# Patient Record
Sex: Female | Born: 1955 | Race: White | Hispanic: No | Marital: Married | Smoking: Never smoker
Health system: Southern US, Community
[De-identification: ages and names within clinical notes are randomized; demographics above are authoritative.]

## PROBLEM LIST (undated history)

## (undated) DIAGNOSIS — E119 Type 2 diabetes mellitus without complications: Secondary | ICD-10-CM

## (undated) HISTORY — PX: ABDOMINAL HYSTERECTOMY: SHX81

## (undated) HISTORY — PX: CHOLECYSTECTOMY: SHX55

---

## 1999-10-07 ENCOUNTER — Other Ambulatory Visit: Admission: RE | Admit: 1999-10-07 | Discharge: 1999-10-07 | Payer: Self-pay | Admitting: Internal Medicine

## 2001-04-23 ENCOUNTER — Other Ambulatory Visit: Admission: RE | Admit: 2001-04-23 | Discharge: 2001-04-23 | Payer: Self-pay | Admitting: Internal Medicine

## 2002-06-18 ENCOUNTER — Inpatient Hospital Stay (HOSPITAL_COMMUNITY): Admission: RE | Admit: 2002-06-18 | Discharge: 2002-06-20 | Payer: Self-pay | Admitting: Obstetrics and Gynecology

## 2004-05-10 ENCOUNTER — Ambulatory Visit: Payer: Self-pay | Admitting: Gastroenterology

## 2004-05-19 ENCOUNTER — Ambulatory Visit (HOSPITAL_COMMUNITY): Admission: RE | Admit: 2004-05-19 | Discharge: 2004-05-19 | Payer: Self-pay | Admitting: Gastroenterology

## 2004-06-13 ENCOUNTER — Ambulatory Visit: Payer: Self-pay | Admitting: Internal Medicine

## 2004-06-17 ENCOUNTER — Ambulatory Visit: Payer: Self-pay | Admitting: Family Medicine

## 2004-07-26 ENCOUNTER — Ambulatory Visit: Payer: Self-pay | Admitting: Internal Medicine

## 2004-10-25 ENCOUNTER — Ambulatory Visit: Payer: Self-pay | Admitting: Internal Medicine

## 2004-12-29 ENCOUNTER — Ambulatory Visit: Payer: Self-pay | Admitting: Internal Medicine

## 2005-01-30 ENCOUNTER — Ambulatory Visit: Payer: Self-pay | Admitting: Internal Medicine

## 2005-02-03 ENCOUNTER — Ambulatory Visit: Payer: Self-pay | Admitting: Internal Medicine

## 2005-02-27 ENCOUNTER — Ambulatory Visit: Payer: Self-pay | Admitting: Internal Medicine

## 2005-03-07 ENCOUNTER — Encounter: Admission: RE | Admit: 2005-03-07 | Discharge: 2005-06-05 | Payer: Self-pay | Admitting: Internal Medicine

## 2005-04-14 ENCOUNTER — Ambulatory Visit: Payer: Self-pay | Admitting: Internal Medicine

## 2005-10-10 ENCOUNTER — Ambulatory Visit: Payer: Self-pay | Admitting: Internal Medicine

## 2019-11-14 ENCOUNTER — Emergency Department (HOSPITAL_COMMUNITY): Payer: HRSA Program

## 2019-11-14 ENCOUNTER — Encounter (HOSPITAL_COMMUNITY): Payer: Self-pay | Admitting: *Deleted

## 2019-11-14 ENCOUNTER — Other Ambulatory Visit: Payer: Self-pay

## 2019-11-14 ENCOUNTER — Inpatient Hospital Stay (HOSPITAL_COMMUNITY)
Admission: EM | Admit: 2019-11-14 | Discharge: 2019-12-02 | DRG: 177 | Disposition: E | Payer: HRSA Program | Attending: Internal Medicine | Admitting: Internal Medicine

## 2019-11-14 DIAGNOSIS — E1169 Type 2 diabetes mellitus with other specified complication: Secondary | ICD-10-CM

## 2019-11-14 DIAGNOSIS — J1282 Pneumonia due to coronavirus disease 2019: Secondary | ICD-10-CM

## 2019-11-14 DIAGNOSIS — I82492 Acute embolism and thrombosis of other specified deep vein of left lower extremity: Secondary | ICD-10-CM | POA: Diagnosis not present

## 2019-11-14 DIAGNOSIS — J9601 Acute respiratory failure with hypoxia: Secondary | ICD-10-CM | POA: Diagnosis not present

## 2019-11-14 DIAGNOSIS — Z6831 Body mass index (BMI) 31.0-31.9, adult: Secondary | ICD-10-CM | POA: Diagnosis not present

## 2019-11-14 DIAGNOSIS — U071 COVID-19: Principal | ICD-10-CM | POA: Diagnosis present

## 2019-11-14 DIAGNOSIS — Z90710 Acquired absence of both cervix and uterus: Secondary | ICD-10-CM

## 2019-11-14 DIAGNOSIS — R630 Anorexia: Secondary | ICD-10-CM | POA: Diagnosis present

## 2019-11-14 DIAGNOSIS — Z7984 Long term (current) use of oral hypoglycemic drugs: Secondary | ICD-10-CM

## 2019-11-14 DIAGNOSIS — F329 Major depressive disorder, single episode, unspecified: Secondary | ICD-10-CM | POA: Diagnosis present

## 2019-11-14 DIAGNOSIS — E1165 Type 2 diabetes mellitus with hyperglycemia: Secondary | ICD-10-CM | POA: Diagnosis not present

## 2019-11-14 DIAGNOSIS — Z515 Encounter for palliative care: Secondary | ICD-10-CM | POA: Diagnosis not present

## 2019-11-14 DIAGNOSIS — R0602 Shortness of breath: Secondary | ICD-10-CM | POA: Diagnosis not present

## 2019-11-14 DIAGNOSIS — E876 Hypokalemia: Secondary | ICD-10-CM | POA: Diagnosis not present

## 2019-11-14 DIAGNOSIS — J80 Acute respiratory distress syndrome: Secondary | ICD-10-CM | POA: Diagnosis present

## 2019-11-14 DIAGNOSIS — I82462 Acute embolism and thrombosis of left calf muscular vein: Secondary | ICD-10-CM | POA: Diagnosis not present

## 2019-11-14 DIAGNOSIS — Z66 Do not resuscitate: Secondary | ICD-10-CM | POA: Diagnosis not present

## 2019-11-14 DIAGNOSIS — R0902 Hypoxemia: Secondary | ICD-10-CM

## 2019-11-14 DIAGNOSIS — E669 Obesity, unspecified: Secondary | ICD-10-CM | POA: Diagnosis present

## 2019-11-14 DIAGNOSIS — J810 Acute pulmonary edema: Secondary | ICD-10-CM | POA: Diagnosis present

## 2019-11-14 DIAGNOSIS — T380X5A Adverse effect of glucocorticoids and synthetic analogues, initial encounter: Secondary | ICD-10-CM | POA: Diagnosis not present

## 2019-11-14 DIAGNOSIS — I824Y2 Acute embolism and thrombosis of unspecified deep veins of left proximal lower extremity: Secondary | ICD-10-CM | POA: Diagnosis not present

## 2019-11-14 DIAGNOSIS — I82409 Acute embolism and thrombosis of unspecified deep veins of unspecified lower extremity: Secondary | ICD-10-CM

## 2019-11-14 DIAGNOSIS — E66812 Obesity, class 2: Secondary | ICD-10-CM

## 2019-11-14 DIAGNOSIS — R0600 Dyspnea, unspecified: Secondary | ICD-10-CM

## 2019-11-14 DIAGNOSIS — R7989 Other specified abnormal findings of blood chemistry: Secondary | ICD-10-CM | POA: Diagnosis not present

## 2019-11-14 HISTORY — DX: Type 2 diabetes mellitus without complications: E11.9

## 2019-11-14 LAB — CBG MONITORING, ED: Glucose-Capillary: 364 mg/dL — ABNORMAL HIGH (ref 70–99)

## 2019-11-14 LAB — C-REACTIVE PROTEIN
CRP: 16.4 mg/dL — ABNORMAL HIGH (ref <1.0)
CRP: 16.2 mg/dL — ABNORMAL HIGH (ref <1.0)

## 2019-11-14 LAB — CBC WITH DIFFERENTIAL/PLATELET
Abs Immature Granulocytes: 0.19 10*3/uL — ABNORMAL HIGH (ref 0.00–0.07)
Basophils Absolute: 0.1 10*3/uL (ref 0.0–0.1)
Basophils Relative: 0 %
Eosinophils Absolute: 0 10*3/uL (ref 0.0–0.5)
Eosinophils Relative: 0 %
HCT: 40.6 % (ref 36.0–46.0)
Hemoglobin: 14.5 g/dL (ref 12.0–15.0)
Immature Granulocytes: 2 %
Lymphocytes Relative: 11 %
Lymphs Abs: 1.3 10*3/uL (ref 0.7–4.0)
MCH: 30.5 pg (ref 26.0–34.0)
MCHC: 35.7 g/dL (ref 30.0–36.0)
MCV: 85.5 fL (ref 80.0–100.0)
Monocytes Absolute: 0.8 10*3/uL (ref 0.1–1.0)
Monocytes Relative: 6 %
Neutro Abs: 10.2 10*3/uL — ABNORMAL HIGH (ref 1.7–7.7)
Neutrophils Relative %: 81 %
Platelets: 203 10*3/uL (ref 150–400)
RBC: 4.75 MIL/uL (ref 3.87–5.11)
RDW: 13.7 % (ref 11.5–15.5)
WBC: 12.5 10*3/uL — ABNORMAL HIGH (ref 4.0–10.5)
nRBC: 0 % (ref 0.0–0.2)

## 2019-11-14 LAB — FERRITIN: Ferritin: 530 ng/mL — ABNORMAL HIGH (ref 11–307)

## 2019-11-14 LAB — TROPONIN I (HIGH SENSITIVITY)
Troponin I (High Sensitivity): 75 ng/L — ABNORMAL HIGH (ref <18)
Troponin I (High Sensitivity): 86 ng/L — ABNORMAL HIGH (ref <18)

## 2019-11-14 LAB — COMPREHENSIVE METABOLIC PANEL
ALT: 28 U/L (ref 0–44)
AST: 46 U/L — ABNORMAL HIGH (ref 15–41)
Albumin: 3.1 g/dL — ABNORMAL LOW (ref 3.5–5.0)
Alkaline Phosphatase: 67 U/L (ref 38–126)
Anion gap: 15 (ref 5–15)
BUN: 16 mg/dL (ref 8–23)
CO2: 22 mmol/L (ref 22–32)
Calcium: 8 mg/dL — ABNORMAL LOW (ref 8.9–10.3)
Chloride: 96 mmol/L — ABNORMAL LOW (ref 98–111)
Creatinine, Ser: 0.77 mg/dL (ref 0.44–1.00)
GFR calc Af Amer: >60 mL/min (ref >60)
GFR calc non Af Amer: >60 mL/min (ref >60)
Glucose, Bld: 392 mg/dL — ABNORMAL HIGH (ref 70–99)
Potassium: 3.7 mmol/L (ref 3.5–5.1)
Sodium: 133 mmol/L — ABNORMAL LOW (ref 135–145)
Total Bilirubin: 1 mg/dL (ref 0.3–1.2)
Total Protein: 7.5 g/dL (ref 6.5–8.1)

## 2019-11-14 LAB — BRAIN NATRIURETIC PEPTIDE: B Natriuretic Peptide: 200.6 pg/mL — ABNORMAL HIGH (ref 0.0–100.0)

## 2019-11-14 LAB — SARS CORONAVIRUS 2 (TAT 6-24 HRS): SARS Coronavirus 2: POSITIVE — AB

## 2019-11-14 LAB — BLOOD GAS, VENOUS
Acid-base deficit: 1.8 mmol/L (ref 0.0–2.0)
Bicarbonate: 23.2 mmol/L (ref 20.0–28.0)
FIO2: 21
O2 Saturation: 54.2 %
Patient temperature: 98.6
pCO2, Ven: 42.6 mmHg — ABNORMAL LOW (ref 44.0–60.0)
pH, Ven: 7.356 (ref 7.250–7.430)
pO2, Ven: 31.9 mmHg — CL (ref 32.0–45.0)

## 2019-11-14 LAB — D-DIMER, QUANTITATIVE: D-Dimer, Quant: 3.5 ug/mL-FEU — ABNORMAL HIGH (ref 0.00–0.50)

## 2019-11-14 LAB — GLUCOSE, CAPILLARY: Glucose-Capillary: 434 mg/dL — ABNORMAL HIGH (ref 70–99)

## 2019-11-14 LAB — HEMOGLOBIN A1C
Hgb A1c MFr Bld: 7.9 % — ABNORMAL HIGH (ref 4.8–5.6)
Mean Plasma Glucose: 180.03 mg/dL

## 2019-11-14 LAB — LACTIC ACID, PLASMA
Lactic Acid, Venous: 2.4 mmol/L (ref 0.5–1.9)
Lactic Acid, Venous: 2 mmol/L (ref 0.5–1.9)

## 2019-11-14 LAB — ABO/RH: ABO/RH(D): A POS

## 2019-11-14 LAB — LACTATE DEHYDROGENASE: LDH: 541 U/L — ABNORMAL HIGH (ref 98–192)

## 2019-11-14 LAB — TRIGLYCERIDES: Triglycerides: 97 mg/dL (ref <150)

## 2019-11-14 LAB — FIBRINOGEN: Fibrinogen: >800 mg/dL — ABNORMAL HIGH (ref 210–475)

## 2019-11-14 LAB — PROCALCITONIN: Procalcitonin: 0.24 ng/mL

## 2019-11-14 MED ORDER — ACETAMINOPHEN 325 MG PO TABS
650.0000 mg | ORAL_TABLET | Freq: Four times a day (QID) | ORAL | Status: DC | PRN
  Administered 2019-11-22: 650 mg via ORAL
  Filled 2019-11-14: qty 2

## 2019-11-14 MED ORDER — ENOXAPARIN SODIUM 40 MG/0.4ML ~~LOC~~ SOLN
40.0000 mg | SUBCUTANEOUS | Status: DC
  Administered 2019-11-14 – 2019-11-15 (×2): 40 mg via SUBCUTANEOUS
  Filled 2019-11-14 (×2): qty 0.4

## 2019-11-14 MED ORDER — SODIUM CHLORIDE 0.9 % IV SOLN: 100.0000 mg | Freq: Every day | INTRAVENOUS | Status: DC

## 2019-11-14 MED ORDER — ZINC SULFATE 220 (50 ZN) MG PO CAPS
220.0000 mg | ORAL_CAPSULE | Freq: Every day | ORAL | Status: DC
  Administered 2019-11-14 – 2019-11-22 (×9): 220 mg via ORAL
  Filled 2019-11-14 (×9): qty 1

## 2019-11-14 MED ORDER — METHYLPREDNISOLONE SODIUM SUCC 40 MG IJ SOLR
0.5000 mg/kg | Freq: Two times a day (BID) | INTRAMUSCULAR | Status: DC
  Administered 2019-11-14 – 2019-11-20 (×12): 36.4 mg via INTRAVENOUS
  Filled 2019-11-14 (×12): qty 1

## 2019-11-14 MED ORDER — SODIUM CHLORIDE 0.9 % IV SOLN
100.0000 mg | INTRAVENOUS | Status: AC
  Administered 2019-11-14 (×2): 100 mg via INTRAVENOUS
  Filled 2019-11-14: qty 20

## 2019-11-14 MED ORDER — LINAGLIPTIN 5 MG PO TABS
5.0000 mg | ORAL_TABLET | Freq: Every day | ORAL | Status: DC
  Administered 2019-11-14 – 2019-11-22 (×9): 5 mg via ORAL
  Filled 2019-11-14 (×10): qty 1

## 2019-11-14 MED ORDER — DEXAMETHASONE SODIUM PHOSPHATE 10 MG/ML IJ SOLN
10.0000 mg | Freq: Once | INTRAMUSCULAR | Status: AC
  Administered 2019-11-14: 10 mg via INTRAVENOUS
  Filled 2019-11-14: qty 1

## 2019-11-14 MED ORDER — HYDROCOD POLST-CPM POLST ER 10-8 MG/5ML PO SUER
5.0000 mL | Freq: Two times a day (BID) | ORAL | Status: DC | PRN
  Administered 2019-11-15 – 2019-11-16 (×2): 5 mL via ORAL
  Filled 2019-11-14 (×3): qty 5

## 2019-11-14 MED ORDER — INSULIN ASPART 100 UNIT/ML ~~LOC~~ SOLN
0.0000 [IU] | Freq: Three times a day (TID) | SUBCUTANEOUS | Status: DC
  Administered 2019-11-14: 15 [IU] via SUBCUTANEOUS
  Administered 2019-11-15: 11 [IU] via SUBCUTANEOUS
  Administered 2019-11-15: 5 [IU] via SUBCUTANEOUS
  Administered 2019-11-15: 11 [IU] via SUBCUTANEOUS
  Administered 2019-11-16: 8 [IU] via SUBCUTANEOUS
  Administered 2019-11-16 (×2): 15 [IU] via SUBCUTANEOUS
  Administered 2019-11-17: 3 [IU] via SUBCUTANEOUS
  Administered 2019-11-17: 15 [IU] via SUBCUTANEOUS
  Administered 2019-11-17: 5 [IU] via SUBCUTANEOUS
  Administered 2019-11-18: 3 [IU] via SUBCUTANEOUS
  Administered 2019-11-18: 8 [IU] via SUBCUTANEOUS
  Administered 2019-11-18: 5 [IU] via SUBCUTANEOUS
  Administered 2019-11-19: 8 [IU] via SUBCUTANEOUS
  Administered 2019-11-19: 3 [IU] via SUBCUTANEOUS
  Administered 2019-11-19: 2 [IU] via SUBCUTANEOUS
  Administered 2019-11-20 (×2): 3 [IU] via SUBCUTANEOUS
  Administered 2019-11-20: 11 [IU] via SUBCUTANEOUS
  Administered 2019-11-21: 8 [IU] via SUBCUTANEOUS
  Administered 2019-11-21 (×2): 5 [IU] via SUBCUTANEOUS
  Administered 2019-11-22: 8 [IU] via SUBCUTANEOUS
  Administered 2019-11-22: 11 [IU] via SUBCUTANEOUS
  Administered 2019-11-22: 8 [IU] via SUBCUTANEOUS
  Administered 2019-11-23: 3 [IU] via SUBCUTANEOUS
  Filled 2019-11-14: qty 0.15

## 2019-11-14 MED ORDER — ALBUTEROL SULFATE HFA 108 (90 BASE) MCG/ACT IN AERS
2.0000 | INHALATION_SPRAY | Freq: Four times a day (QID) | RESPIRATORY_TRACT | Status: DC
  Administered 2019-11-14 – 2019-11-19 (×20): 2 via RESPIRATORY_TRACT
  Filled 2019-11-14: qty 6.7

## 2019-11-14 MED ORDER — ORAL CARE MOUTH RINSE
15.0000 mL | Freq: Two times a day (BID) | OROMUCOSAL | Status: DC
  Administered 2019-11-14 – 2019-11-22 (×14): 15 mL via OROMUCOSAL

## 2019-11-14 MED ORDER — TOCILIZUMAB 400 MG/20ML IV SOLN
8.0000 mg/kg | Freq: Once | INTRAVENOUS | Status: AC
  Administered 2019-11-14: 580 mg via INTRAVENOUS
  Filled 2019-11-14: qty 29

## 2019-11-14 MED ORDER — GUAIFENESIN-DM 100-10 MG/5ML PO SYRP
10.0000 mL | ORAL_SOLUTION | ORAL | Status: DC | PRN
  Administered 2019-11-15 – 2019-11-22 (×6): 10 mL via ORAL
  Filled 2019-11-14 (×9): qty 10

## 2019-11-14 MED ORDER — ASCORBIC ACID 500 MG PO TABS
500.0000 mg | ORAL_TABLET | Freq: Every day | ORAL | Status: DC
  Administered 2019-11-14 – 2019-11-22 (×9): 500 mg via ORAL
  Filled 2019-11-14 (×9): qty 1

## 2019-11-14 MED ORDER — ONDANSETRON HCL 4 MG/2ML IJ SOLN
4.0000 mg | Freq: Four times a day (QID) | INTRAMUSCULAR | Status: DC | PRN
  Administered 2019-11-20: 4 mg via INTRAVENOUS
  Filled 2019-11-14: qty 2

## 2019-11-14 MED ORDER — INSULIN ASPART 100 UNIT/ML ~~LOC~~ SOLN
0.0000 [IU] | Freq: Every day | SUBCUTANEOUS | Status: DC
  Administered 2019-11-15: 3 [IU] via SUBCUTANEOUS
  Administered 2019-11-17: 2 [IU] via SUBCUTANEOUS
  Administered 2019-11-19: 3 [IU] via SUBCUTANEOUS
  Administered 2019-11-21 – 2019-11-22 (×2): 2 [IU] via SUBCUTANEOUS
  Filled 2019-11-14: qty 0.05

## 2019-11-14 MED ORDER — ONDANSETRON HCL 4 MG PO TABS: 4.0000 mg | ORAL_TABLET | Freq: Four times a day (QID) | ORAL | Status: DC | PRN

## 2019-11-14 MED ORDER — INSULIN ASPART 100 UNIT/ML ~~LOC~~ SOLN
20.0000 [IU] | Freq: Once | SUBCUTANEOUS | Status: AC
  Administered 2019-11-14: 20 [IU] via SUBCUTANEOUS

## 2019-11-14 MED ORDER — SODIUM CHLORIDE 0.9 % IV SOLN: 200.0000 mg | Freq: Once | INTRAVENOUS | Status: DC

## 2019-11-14 MED ORDER — SODIUM CHLORIDE 0.9 % IV SOLN
100.0000 mg | Freq: Every day | INTRAVENOUS | Status: AC
  Administered 2019-11-15 – 2019-11-18 (×4): 100 mg via INTRAVENOUS
  Filled 2019-11-14 (×4): qty 20

## 2019-11-14 NOTE — Progress Notes (Signed)
Came to ED to assess pt. Pt is on NRB O2 saturations 90-92% respiratory rate 32. Recommending pt trying to prone if possible.

## 2019-11-14 NOTE — ED Notes (Signed)
Pt wants to call son and update him

## 2019-11-14 NOTE — ED Notes (Signed)
Per patient, please update Silverio Lay (friend) at [phone] with any information.

## 2019-11-14 NOTE — ED Notes (Signed)
Pt sitting up in bed awake and talking. Pt gives permission to update son Barbara Cower. Denies any needs. Will continue to monitor.

## 2019-11-14 NOTE — H&P (Addendum)
History and Physical    Katrina Willis:[id number] DOB: February 24, 1956 DOA: 12/06/2019  I have briefly reviewed the Katrina Willis's prior medical records in [place]  PCP: Katrina Willis, No Pcp Per  Katrina Willis coming from: home  Chief Complaint: Shortness of breath  HPI: Katrina Willis is a 64 y.o. female without any significant medical problems but remote history of diabetes, comes to the hospital with complaints of shortness of breath.  Katrina Willis normally resides in [place] and recently traveled to the [place] on 5/4 to visit her son in [place] and now here in town for some friends.  She tells me that for the past 10 days since arrival to the [place] she has been experiencing flulike symptoms.  She tells me her son is also sick.  She was having a cough which actually appreciates that he has improved in the last couple of days.  Is also been complaining of fatigue, weakness, poor p.o. intake.  No nausea, no vomiting, no diarrhea.  No loss of taste or smell.  No chest pain.  Tells me that her shortness of breath has gotten significantly worse in the last 24 hours and she is barely able to get out of bed and take a few steps.  She [facility] and is returned positive  ED Course: In the emergency room she is afebrile to 97.8, however she is tachypneic with respiratory rate into the 30s, normotensive, and she was satting in the 60s on room air requiring 15 L nonrebreather to maintain sats in the low 90s.  Chest x-ray showed multifocal pneumonia.  Blood work reveals an elevated glucose to 392, CRP is fairly elevated to 16.4.  Lactic acid 2.4.  High-sensitivity troponin 75.  BNP 200.  D-dimer 3.5.  SARS-CoV-2 here pending  Review of Systems: All systems reviewed, and apart from HPI, all negative  Past Medical History:  Diagnosis Date  . Diabetes mellitus without complication [facility])     Past Surgical History:  Procedure Laterality Date  . ABDOMINAL HYSTERECTOMY    .  CHOLECYSTECTOMY       reports that she has never smoked. She has never used smokeless tobacco. She reports previous alcohol use. She reports previous drug use.  Not on File  Family history reviewed and noncontributory  Prior to Admission medications   Not on File    Physical Exam: Vitals:   12-06-2019 1445 12/06/2019 1500 2019/12/06 1515 12-06-2019 1530  BP: 123/70 (!) 137/108 105/86 120/66  Pulse: 77 77 73 73  Resp: (!) 33 (!) 28 (!) 32 (!) 32  Temp:      TempSrc:      SpO2: 90% 92% 92% 91%  Weight:      Height:        Constitutional: Tachypneic, appears a bit uncomfortable Eyes: No scleral icterus ENMT: Mucous membranes are moist.  Neck: normal, supple Respiratory: Bibasilar rhonchi, no wheezing heard.  Increased respiratory effort, tachypneic Cardiovascular: Regular rate and rhythm, no murmurs / rubs / gallops. No extremity edema.   Abdomen: no tenderness, no masses palpated. Bowel sounds positive.  Musculoskeletal: no clubbing / cyanosis. Skin: No rashes seen Neurologic: No focal deficits, equal strength Psychiatric: Normal judgment and insight. Alert and oriented x 3.  Labs on Admission: I have personally reviewed following labs and imaging studies  CBC: Recent Labs  Lab 12-06-2019 1407  WBC 12.5*  NEUTROABS 10.2*  HGB 14.5  HCT 40.6  MCV 85.5  PLT 203  Basic Metabolic Panel: Recent Labs  Lab 11/30/2019 1407  NA 133*  K 3.7  CL 96*  CO2 22  GLUCOSE 392*  BUN 16  CREATININE 0.77  CALCIUM 8.0*   Liver Function Tests: Recent Labs  Lab 11/24/2019 1407  AST 46*  ALT 28  ALKPHOS 67  BILITOT 1.0  PROT 7.5  ALBUMIN 3.1*   Coagulation Profile: No results for input(s): INR, PROTIME in the last 168 hours. BNP (last 3 results) No results for input(s): PROBNP in the last 8760 hours. CBG: No results for input(s): GLUCAP in the last 168 hours. Thyroid Function Tests: No results for input(s): TSH, T4TOTAL, FREET4, T3FREE, THYROIDAB in the last 72  hours. Urine analysis: No results found for: COLORURINE, APPEARANCEUR, LABSPEC, PHURINE, GLUCOSEU, HGBUR, BILIRUBINUR, KETONESUR, PROTEINUR, UROBILINOGEN, NITRITE, LEUKOCYTESUR   Radiological Exams on Admission: DG Chest Port 1 View  Result Date: 11/10/2019 CLINICAL DATA:  Cough, shortness of breath, and headache. COVID positive. EXAM: PORTABLE CHEST 1 VIEW COMPARISON:  None. FINDINGS: The heart size and mediastinal contours are within normal limits. Normal pulmonary vascularity. Bilateral mid and lower lung interstitial and hazy airspace opacities. No pleural effusion or pneumothorax. No acute osseous abnormality. IMPRESSION: 1. Multifocal pneumonia consistent with history of COVID-19 infection. Electronically Signed   By: Obie Dredge M.D.   On: 11/22/2019 13:01    EKG: Independently reviewed.  Sinus rhythm  Assessment/Plan  Principal Problem Acute hypoxic respiratory failure due to COVID-19 viral pneumonia-Katrina Willis with profound hypoxia requiring nonrebreather in the ED.  Will admit to stepdown, she is high risk of decompensation.  Her CRP significantly elevated, I have discussed risks/benefits regarding Actemra and she agrees to its use. The Katrina Willis has hypoxia and is high-risk for intubation (due to age, CRP), but expected to survive >48 hours and has good baseline functional status.  She is not known to be on immunomodulators, anti-rejection medications, or cancer chemotherapy, has no history of TB or latent TB, and no history of diverticulitis or intestinal perforation.  Platelets are >50K, ANC is >500, and ALT/AST are below 5x ULN with no known hepatitis B infection. The investigational nature of this medication was discussed with the Katrina Willis/HCPOA and she agrees the use of this medication.  Will administer as soon as her Covid test confirms positivity here -Start remdesivir, steroids  COVID-19 Labs  Recent Labs    11/02/2019 1240 11/12/2019 1407  DDIMER  --  3.50*  FERRITIN 530*  --    LDH  --  541*  CRP 16.4*  --     No results found for: SARSCOV2NAA    DM2 -Katrina Willis does not seem to be medications for this however her CBG on admission is elevated at 392.  Placed on sliding scale, will likely need long-acting insulin while here  DVT prophylaxis: Lovenox  Code Status: Full code  Family Communication: d/w Katrina Willis  Disposition Plan: home when ready  Bed Type: stepdown Consults called: none  Obs/Inp: inpatient   At the time of admission, it appears that the appropriate admission status for this Katrina Willis is INPATIENT as it is expected that Katrina Willis will require hospital care > 2 midnights. This is judged to be reasonable and necessary in order to provide the required intensity of service to ensure the Katrina Willis's safety given: presenting symptoms, initial radiographic and laboratory data and verity of hypoxia. Together, these circumstances are felt to place Katrina Willis at high at high risk for further clinical deterioration threatening life, limb, or organ.  Addendum: Our SARS-CoV-2 test has  not resulted yet, [facility] [address] and [facility] were able to confirm [facility], faxed [place] positive antigen test done yesterday 4/13.  Administer Actemra      Pamella Pert, MD, PhD [facility]  Contact via [email or web address]  11/05/2019, 3:49 PM

## 2019-11-14 NOTE — ED Triage Notes (Signed)
Pt reports feeling short of breath, cough, weakness, headaches 10 days ago after leaving [place]. [facility] No fevers.

## 2019-11-14 NOTE — ED Provider Notes (Addendum)
[facility]-EMERGENCY DEPT Provider Note   CSN: [id number] Arrival date & time: 12-10-19  1112     History Chief Complaint  Patient presents with  . Weakness    COVID positive    Katrina Willis is a 64 y.o. female history of diabetes otherwise healthy.  Patient presents today for shortness of breath, cough, generalized weakness.  She reports she recently returned to [place] from [place], she was visiting her son in [place] when she became ill.  She reports that on May 4 that she developed body aches fatigue nonproductive cough.  Symptoms have gradually worsened over the last 10 days, her primary complaint today is shortness of breath worsened with ambulation feeling of inability to catch her breath minimally improved with rest.  Associated with nonproductive cough, burning chest pain only with cough and generalized weakness.  She reports that [facility] and was positive.  She has not had her Covid vaccine.  Patient reports feeling warm at home but denies fever.  Denies vision change, sore throat, neck stiffness, hemoptysis, abdominal pain, nausea/vomiting, diarrhea, dysuria, extremity swelling/color change or any additional concerns.  HPI     Past Medical History:  Diagnosis Date  . Diabetes mellitus without complication (HCC)     There are no problems to display for this patient.   Past Surgical History:  Procedure Laterality Date  . ABDOMINAL HYSTERECTOMY    . CHOLECYSTECTOMY       OB History   No obstetric history on file.     No family history on file.  Social History   Tobacco Use  . Smoking status: Never Smoker  . Smokeless tobacco: Never Used  Substance Use Topics  . Alcohol use: Not Currently  . Drug use: Not Currently    Home Medications Prior to Admission medications   Not on File    Allergies    Patient has no allergy information on record.  Review of Systems   Review of Systems Ten  systems are reviewed and are negative for acute change except as noted in the HPI  Physical Exam Updated Vital Signs BP 136/61 (BP Location: Left Arm)   Pulse 84   Temp 97.8 F (36.6 C) (Oral)   Resp (!) 22   SpO2 99%   Physical Exam Constitutional:      General: She is not in acute distress.    Appearance: Normal appearance. She is well-developed. She is not ill-appearing or diaphoretic.  HENT:     Head: Normocephalic and atraumatic.     Right Ear: External ear normal.     Left Ear: External ear normal.     Nose: Nose normal.  Eyes:     General: Vision grossly intact. Gaze aligned appropriately.     Pupils: Pupils are equal, round, and reactive to light.  Neck:     Trachea: Trachea and phonation normal. No tracheal deviation.  Cardiovascular:     Rate and Rhythm: Normal rate and regular rhythm.     Pulses: Normal pulses.     Heart sounds: Normal heart sounds.  Pulmonary:     Effort: Pulmonary effort is normal. No respiratory distress.     Breath sounds: Normal breath sounds.  Abdominal:     General: There is no distension.     Palpations: Abdomen is soft.     Tenderness: There is no abdominal tenderness. There is no guarding or rebound.  Musculoskeletal:  General: Normal range of motion.     Cervical back: Normal range of motion.     Right lower leg: No edema.     Left lower leg: No edema.  Skin:    General: Skin is warm and dry.  Neurological:     Mental Status: She is alert.     GCS: GCS eye subscore is 4. GCS verbal subscore is 5. GCS motor subscore is 6.     Comments: Speech is clear and goal oriented, follows commands Major Cranial nerves without deficit, no facial droop Moves extremities without ataxia, coordination intact  Psychiatric:        Behavior: Behavior normal.     ED Results / Procedures / Treatments   Labs (all labs ordered are listed, but only abnormal results are displayed) Labs Reviewed - No data to  display  EKG None  Radiology No results found.  Procedures .Critical Care Performed by: Bill Salinas, PA-C Authorized by: Bill Salinas, PA-C   Critical care provider statement:    Critical care time (minutes):  50   Critical care was necessary to treat or prevent imminent or life-threatening deterioration of the following conditions:  Respiratory failure   Critical care was time spent personally by me on the following activities:  Discussions with consultants, evaluation of patient's response to treatment, examination of patient, ordering and performing treatments and interventions, ordering and review of laboratory studies, ordering and review of radiographic studies, pulse oximetry, re-evaluation of patient's condition, obtaining history from patient or surrogate, review of old charts and development of treatment plan with patient or surrogate   (including critical care time)  Medications Ordered in ED Medications - No data to display  ED Course  I have reviewed the triage vital signs and the nursing notes.  Pertinent labs & imaging results that were available during my care of the patient were reviewed by me and considered in my medical decision making (see chart for details).     Katrina Willis was evaluated in Emergency Department on 11-21-2019 for the symptoms described in the history of present illness. She was evaluated in the context of the global COVID-19 pandemic, which necessitated consideration that the patient might be at risk for infection with the SARS-CoV-2 virus that causes COVID-19. Institutional protocols and algorithms that pertain to the evaluation of patients at risk for COVID-19 are in a state of rapid change based on information released by regulatory bodies including the CDC and federal and state organizations. These policies and algorithms were followed during the patient's care in the ED. MDM Rules/Calculators/A&P                       64 year old female with history of diabetes presents today on her [facility] day [facility]  Cranial nerves intact, no meningeal signs, airway patent, heart regular rate and rhythm, lungs clear, abdomen soft nontender, neurovascular tact to all 4 extremities.  No evidence of DVT.  She mainly is presenting for shortness of breath.  SPO2 60% on room air on arrival.  Improved with nonrebreather.  She denies any history of blood clots extremity swelling or additional concerns today.  She denies chest pain as long as she is not coughing.  Patient will need admission, Covid order set utilized, I have added a troponin and BNP as well.  10 mg Decadron ordered empirically.  Will await hospitalist recommendations for remdesivir.  Patient stable on nonrebreather.  Patient  seen and evaluated by Dr. Zenia Resides agrees with care plan. --------------------------------- Blood gas is significant for PO2 of 31.9.  All remaining blood work is pending.  Chest x-ray:  IMPRESSION:  1. Multifocal pneumonia consistent with history of COVID-19  infection.  I personally reviewed patient's chest x-ray and agree with radiologist interpretation. - Patient reassessed sats remain upper 90s on nonrebreather.  No acute distress.  Patient seen and evaluated by respiratory therapy who recommended proning which will be facilitated by RN.  Consult placed to hospitalist for admission awaiting callback.  Suspect symptoms today secondary to COVID-19 viral infection and low suspicion for ACS, PE, dissection or other pathologies at this time. - 3:15 PM: Discussed case with Dr. Cruzita Lederer, patient has been accepted to hospitalist service.  CBC with leukocytosis of 12.5, no evidence of anemia, white counts are pending.  Lactic 2.4.  Remainder of labs are pending at this time.  Note: Portions of this report may have been transcribed using voice recognition software. Every effort was made to ensure accuracy; however, inadvertent  computerized transcription errors may still be present. Final Clinical Impression(s) / ED Diagnoses Final diagnoses:  SOB (shortness of breath)    Rx / DC Orders ED Discharge Orders    None        Gari Crown 11/13/2019 1518    Lacretia Leigh, MD 11/15/19 1558

## 2019-11-15 ENCOUNTER — Inpatient Hospital Stay (HOSPITAL_COMMUNITY): Payer: HRSA Program

## 2019-11-15 DIAGNOSIS — E1165 Type 2 diabetes mellitus with hyperglycemia: Secondary | ICD-10-CM

## 2019-11-15 LAB — COMPREHENSIVE METABOLIC PANEL
ALT: 29 U/L (ref 0–44)
AST: 43 U/L — ABNORMAL HIGH (ref 15–41)
Albumin: 2.9 g/dL — ABNORMAL LOW (ref 3.5–5.0)
Alkaline Phosphatase: 65 U/L (ref 38–126)
Anion gap: 9 (ref 5–15)
BUN: 24 mg/dL — ABNORMAL HIGH (ref 8–23)
CO2: 24 mmol/L (ref 22–32)
Calcium: 8.4 mg/dL — ABNORMAL LOW (ref 8.9–10.3)
Chloride: 103 mmol/L (ref 98–111)
Creatinine, Ser: 0.76 mg/dL (ref 0.44–1.00)
GFR calc Af Amer: >60 mL/min (ref >60)
GFR calc non Af Amer: >60 mL/min (ref >60)
Glucose, Bld: 356 mg/dL — ABNORMAL HIGH (ref 70–99)
Potassium: 3.5 mmol/L (ref 3.5–5.1)
Sodium: 136 mmol/L (ref 135–145)
Total Bilirubin: 0.9 mg/dL (ref 0.3–1.2)
Total Protein: 7.1 g/dL (ref 6.5–8.1)

## 2019-11-15 LAB — CBC WITH DIFFERENTIAL/PLATELET
Abs Immature Granulocytes: 0.18 10*3/uL — ABNORMAL HIGH (ref 0.00–0.07)
Basophils Absolute: 0 10*3/uL (ref 0.0–0.1)
Basophils Relative: 0 %
Eosinophils Absolute: 0 10*3/uL (ref 0.0–0.5)
Eosinophils Relative: 0 %
HCT: 38.5 % (ref 36.0–46.0)
Hemoglobin: 13.6 g/dL (ref 12.0–15.0)
Immature Granulocytes: 2 %
Lymphocytes Relative: 13 %
Lymphs Abs: 1.3 10*3/uL (ref 0.7–4.0)
MCH: 30.4 pg (ref 26.0–34.0)
MCHC: 35.3 g/dL (ref 30.0–36.0)
MCV: 85.9 fL (ref 80.0–100.0)
Monocytes Absolute: 0.3 10*3/uL (ref 0.1–1.0)
Monocytes Relative: 3 %
Neutro Abs: 8.3 10*3/uL — ABNORMAL HIGH (ref 1.7–7.7)
Neutrophils Relative %: 82 %
Platelets: 206 10*3/uL (ref 150–400)
RBC: 4.48 MIL/uL (ref 3.87–5.11)
RDW: 14.1 % (ref 11.5–15.5)
WBC: 9.7 10*3/uL (ref 4.0–10.5)
nRBC: 0 % (ref 0.0–0.2)

## 2019-11-15 LAB — GLUCOSE, CAPILLARY
Glucose-Capillary: 360 mg/dL — ABNORMAL HIGH (ref 70–99)
Glucose-Capillary: 266 mg/dL — ABNORMAL HIGH (ref 70–99)
Glucose-Capillary: 213 mg/dL — ABNORMAL HIGH (ref 70–99)
Glucose-Capillary: 316 mg/dL — ABNORMAL HIGH (ref 70–99)
Glucose-Capillary: 337 mg/dL — ABNORMAL HIGH (ref 70–99)
Glucose-Capillary: 271 mg/dL — ABNORMAL HIGH (ref 70–99)

## 2019-11-15 LAB — HEMOGLOBIN A1C
Hgb A1c MFr Bld: 8.1 % — ABNORMAL HIGH (ref 4.8–5.6)
Mean Plasma Glucose: 185.77 mg/dL

## 2019-11-15 LAB — MRSA PCR SCREENING: MRSA by PCR: NEGATIVE

## 2019-11-15 LAB — C-REACTIVE PROTEIN: CRP: 13.8 mg/dL — ABNORMAL HIGH (ref <1.0)

## 2019-11-15 LAB — D-DIMER, QUANTITATIVE: D-Dimer, Quant: 3.99 ug/mL-FEU — ABNORMAL HIGH (ref 0.00–0.50)

## 2019-11-15 LAB — HIV ANTIBODY (ROUTINE TESTING W REFLEX): HIV Screen 4th Generation wRfx: NONREACTIVE

## 2019-11-15 MED ORDER — SODIUM CHLORIDE 0.9% IV SOLUTION: Freq: Once | INTRAVENOUS | Status: DC

## 2019-11-15 MED ORDER — HYDROXYCHLOROQUINE SULFATE 200 MG PO TABS
200.0000 mg | ORAL_TABLET | Freq: Two times a day (BID) | ORAL | Status: AC
  Administered 2019-11-16 – 2019-11-20 (×8): 200 mg via ORAL
  Filled 2019-11-15 (×8): qty 1

## 2019-11-15 MED ORDER — LACTATED RINGERS IV BOLUS
500.0000 mL | Freq: Once | INTRAVENOUS | Status: AC
  Administered 2019-11-15: 500 mL via INTRAVENOUS

## 2019-11-15 MED ORDER — HYDROXYCHLOROQUINE SULFATE 200 MG PO TABS
400.0000 mg | ORAL_TABLET | Freq: Two times a day (BID) | ORAL | Status: AC
  Administered 2019-11-15 – 2019-11-16 (×2): 400 mg via ORAL
  Filled 2019-11-15 (×2): qty 2

## 2019-11-15 MED ORDER — CHLORHEXIDINE GLUCONATE CLOTH 2 % EX PADS
6.0000 | MEDICATED_PAD | Freq: Every day | CUTANEOUS | Status: DC
  Administered 2019-11-14 – 2019-11-22 (×7): 6 via TOPICAL

## 2019-11-15 MED ORDER — FUROSEMIDE 10 MG/ML IJ SOLN
20.0000 mg | Freq: Once | INTRAMUSCULAR | Status: AC
  Administered 2019-11-15: 20 mg via INTRAVENOUS
  Filled 2019-11-15: qty 2

## 2019-11-15 MED ORDER — LACTATED RINGERS IV BOLUS: 100.0000 mL | Freq: Once | INTRAVENOUS | Status: DC

## 2019-11-15 MED ORDER — INSULIN ASPART 100 UNIT/ML ~~LOC~~ SOLN
20.0000 [IU] | Freq: Once | SUBCUTANEOUS | Status: AC
  Administered 2019-11-15: 20 [IU] via SUBCUTANEOUS

## 2019-11-15 MED ORDER — IOHEXOL 350 MG/ML SOLN
100.0000 mL | Freq: Once | INTRAVENOUS | Status: AC | PRN
  Administered 2019-11-15: 100 mL via INTRAVENOUS

## 2019-11-15 MED ORDER — SODIUM CHLORIDE (PF) 0.9 % IJ SOLN
INTRAMUSCULAR | Status: AC
  Filled 2019-11-15: qty 50

## 2019-11-15 MED ORDER — INSULIN DETEMIR 100 UNIT/ML ~~LOC~~ SOLN
10.0000 [IU] | Freq: Two times a day (BID) | SUBCUTANEOUS | Status: DC
  Administered 2019-11-15 (×3): 10 [IU] via SUBCUTANEOUS
  Filled 2019-11-15 (×4): qty 0.1

## 2019-11-15 NOTE — Progress Notes (Signed)
PT wanted to try ambulating to [facility] so I got her up with the assistance of a NT. PT was more unsteady on her feet than she previously had been and spo2 quickly dropped to the low 60's while still wearing NRB and HRNC- both at 15lpm. It has taken the patient 30 minutes to return to her goal spo2 range which is 85%. I told the patient that she will need to try to use a bed pan next time she needs the restroom, as she refuses to wear a purewick at this time. Will pass along findings to dayshift nurse. Will continue to assess patient for any changes or signs of worsening condition.

## 2019-11-15 NOTE — Progress Notes (Addendum)
PROGRESS NOTE  Katrina Willis FAO:[id number] DOB: 01-16-1956 DOA: 11/16/2019 PCP: Patient, No Pcp Per   LOS: 1 day   Brief Narrative / Interim history: 64 year old female with history of diabetes mellitus came into the hospital with shortness of breath, was diagnosed with Covid 19 pneumonia and hypoxic respiratory failure and was admitted to stepdown on 5/14.  Subjective / 24h Interval events: On nonrebreather and high flow nasal cannula, satting in the low 90s and states that she is feeling comfortable unless she has to move around.  No chest pain.  No nausea or vomiting, no abdominal pain.  Assessment & Plan: Principal Problem Acute hypoxic respiratory failure due to COVID-19 viral pneumonia /ARDS-she remains profoundly hypoxic this morning requiring 15 L high flow nasal cannula along with a nonrebreather for comfort.  She is satting in the low 90s.  She is at high risk for intubation.  She was started on steroids along with remdesivir, has received Actemra on 5/14 -Patient and her family insistent about hydroxychloroquine, I have discussed this personally with the patient, this is not an FDA approved medication, risks discussed and she agrees to proceed forward.  I will obtain a baseline EKG and continue to monitor QT daily, monitor on telemetry -D-dimer slightly higher, obtain CT angiogram today -Prone is stable, incentive spirometry  COVID-19 Labs  Recent Labs    11/09/2019 1240 11/16/2019 1407 11/11/2019 1821 11/15/19 0238  DDIMER  --  3.50*  --  3.99*  FERRITIN 530*  --   --   --   LDH  --  541*  --   --   CRP 16.4*  --  16.2* 13.8*    Lab Results  Component Value Date   SARSCOV2NAA POSITIVE (A) 11/16/2019   Active Problems Type 2 diabetes mellitus, poorly controlled, with hyperglycemia-her hemoglobin A1c is 7.9.  Her CBGs are worse in the setting of COVID-19 along with steroids.  Placed on long-acting insulin along with a sliding scale.  Scheduled Meds: . sodium chloride    Intravenous Once  . albuterol  2 puff Inhalation Q6H  . vitamin C  500 mg Oral Daily  . Chlorhexidine Gluconate Cloth  6 each Topical Daily  . enoxaparin (LOVENOX) injection  40 mg Subcutaneous Q24H  . hydroxychloroquine  400 mg Oral BID   Followed by  . [START ON 11/16/2019] hydroxychloroquine  200 mg Oral BID  . insulin aspart  0-15 Units Subcutaneous TID WC  . insulin aspart  0-5 Units Subcutaneous QHS  . insulin detemir  10 Units Subcutaneous BID  . linagliptin  5 mg Oral Daily  . mouth rinse  15 mL Mouth Rinse BID  . methylPREDNISolone (SOLU-MEDROL) injection  0.5 mg/kg Intravenous Q12H  . zinc sulfate  220 mg Oral Daily   Continuous Infusions: . remdesivir 100 mg in NS 100 mL Stopped (11/15/19 0922)   PRN Meds:.acetaminophen, chlorpheniramine-HYDROcodone, guaiFENesin-dextromethorphan, ondansetron **OR** ondansetron (ZOFRAN) IV  DVT prophylaxis: Lovenox Code Status: Full code Family Communication: Discussed with patient's brother Wynema Birch [phone], who is an anesthesiologist  Status is: Inpatient  Remains inpatient appropriate because:Inpatient level of care appropriate due to severity of illness  Dispo: The patient is from: Home              Anticipated d/c is to: Home              Anticipated d/c date is: > 3 days              Patient currently is  not medically stable to d/c.  Consultants:  None   Procedures:  none  Microbiology  None   Antimicrobials: None     Objective: Vitals:   11/15/19 0400 11/15/19 0837 11/15/19 0900 11/15/19 1200  BP: 128/70  (!) 145/35 (!) 158/74  Pulse: 62  72 67  Resp: 17  (!) 24 (!) 23  Temp: (!) 97.4 F (36.3 C) 97.9 F (36.6 C)    TempSrc: Axillary Axillary    SpO2: (!) 86%  (!) 89% (!) 88%  Weight:      Height:        Intake/Output Summary (Last 24 hours) at 11/15/2019 1215 Last data filed at 11/15/2019 1100 Gross per 24 hour  Intake 308.81 ml  Output 500 ml  Net -191.19 ml   Filed Weights   11/22/2019 1402  11/09/2019 2106  Weight: 72.6 kg 82.2 kg    Examination:  Constitutional: Tachypneic but overall appears comfortable at rest Eyes: no scleral icterus ENMT: Mucous membranes are moist.  Neck: normal, supple Respiratory: clear to auscultation bilaterally, no wheezing, no crackles. Normal respiratory effort. No accessory muscle use.  Cardiovascular: Regular rate and rhythm, no murmurs / rubs / gallops. No LE edema. Good peripheral pulses Abdomen: non distended, no tenderness. Bowel sounds positive.  Musculoskeletal: no clubbing / cyanosis.  Skin: no rashes Neurologic: CN 2-12 grossly intact. Strength 5/5 in all 4.  Psychiatric: Normal judgment and insight. Alert and oriented x 3. Normal mood.    Data Reviewed: I have independently reviewed following labs and imaging studies   CBC: Recent Labs  Lab 11/30/2019 1407 11/15/19 0238  WBC 12.5* 9.7  NEUTROABS 10.2* 8.3*  HGB 14.5 13.6  HCT 40.6 38.5  MCV 85.5 85.9  PLT 203 206   Basic Metabolic Panel: Recent Labs  Lab 11/11/2019 1407 11/15/19 0238  NA 133* 136  K 3.7 3.5  CL 96* 103  CO2 22 24  GLUCOSE 392* 356*  BUN 16 24*  CREATININE 0.77 0.76  CALCIUM 8.0* 8.4*   Liver Function Tests: Recent Labs  Lab 11/08/2019 1407 11/15/19 0238  AST 46* 43*  ALT 28 29  ALKPHOS 67 65  BILITOT 1.0 0.9  PROT 7.5 7.1  ALBUMIN 3.1* 2.9*   Coagulation Profile: No results for input(s): INR, PROTIME in the last 168 hours. HbA1C: Recent Labs    11/22/2019 1821 11/15/19 0238  HGBA1C 7.9* 8.1*   CBG: Recent Labs  Lab 11/25/2019 1706 11/22/2019 2113 11/15/19 0100 11/15/19 0448 11/15/19 0822  GLUCAP 364* 434* 360* 266* 213*    Recent Results (from the past 240 hour(s))  Blood Culture (routine x 2)     Status: None (Preliminary result)   Collection Time: 11/06/2019 12:40 PM   Specimen: BLOOD  Result Value Ref Range Status   Specimen Description   Final    BLOOD RIGHT ANTECUBITAL Performed at [facility], [address], [place], [place] [address]    Special Requests   Final    BOTTLES DRAWN AEROBIC AND ANAEROBIC Blood Culture adequate volume Performed at [facility], [address] [place], [place] [address]    Culture   Final    NO GROWTH < 24 HOURS Performed at [facility], [address] [place], [place] [address]    Report Status PENDING  Incomplete  Blood Culture (routine x 2)     Status: None (Preliminary result)   Collection Time: 11/25/2019 12:45 PM   Specimen: BLOOD  Result Value  Ref Range Status   Specimen Description   Final    BLOOD RIGHT ANTECUBITAL Performed at [facility], [address], [place], [place] [address]    Special Requests   Final    BOTTLES DRAWN AEROBIC AND ANAEROBIC Blood Culture adequate volume Performed at [facility], [address] [place], [place] [address]    Culture   Final    NO GROWTH < 24 HOURS Performed at [facility], [address], [place], [place] [address]    Report Status PENDING  Incomplete  SARS CORONAVIRUS 2 (TAT 6-24 HRS) Nasopharyngeal Nasopharyngeal Swab     Status: Abnormal   Collection Time: 2019-11-24  2:07 PM   Specimen: Nasopharyngeal Swab  Result Value Ref Range Status   SARS Coronavirus 2 POSITIVE (A) NEGATIVE Final    Comment: RESULT CALLED TO, READ BACK BY AND VERIFIED WITH: RN GARCIA KELLY AT 2315 BY MESSAN HOUEGNIFIO ON 11/15/2019 (NOTE) SARS-CoV-2 target nucleic acids are DETECTED. The SARS-CoV-2 RNA is generally detectable in upper and lower respiratory specimens during the acute phase of infection. Positive results are indicative of the presence of SARS-CoV-2 RNA. Clinical correlation with patient history and other diagnostic information is  necessary to determine patient infection status. Positive results do not rule out bacterial infection or co-infection with other viruses.  The expected result is Negative. Fact Sheet for  Patients: HairSlick.no Fact Sheet for Healthcare Providers: quierodirigir.com This test is not yet approved or cleared by the [place] FDA and  has been authorized for detection and/or diagnosis of SARS-CoV-2 by FDA under an Emergency Use Authorization (EUA). This EUA will remain  in effect (meaning this tes t can be used) for the duration of the COVID-19 declaration under Section 564(b)(1) of the Act, 21 U.S.C. section 360bbb-3(b)(1), unless the authorization is terminated or revoked sooner. Performed at [facility], [address], [place], [place] [address]   MRSA PCR Screening     Status: None   Collection Time: 11/15/19  1:06 AM   Specimen: Nasal Mucosa; Nasopharyngeal  Result Value Ref Range Status   MRSA by PCR NEGATIVE NEGATIVE Final    Comment:        The GeneXpert MRSA Assay (FDA approved for NASAL specimens only), is one component of a comprehensive MRSA colonization surveillance program. It is not intended to diagnose MRSA infection nor to guide or monitor treatment for MRSA infections. Performed at [facility], [address] [place], [place] [address]      Radiology Studies: [facility] Chest Port 1 View  Result Date: 11/24/2019 CLINICAL DATA:  Cough, shortness of breath, and headache. COVID positive. EXAM: PORTABLE CHEST 1 VIEW COMPARISON:  None. FINDINGS: The heart size and mediastinal contours are within normal limits. Normal pulmonary vascularity. Bilateral mid and lower lung interstitial and hazy airspace opacities. No pleural effusion or pneumothorax. No acute osseous abnormality. IMPRESSION: 1. Multifocal pneumonia consistent with history of COVID-19 infection. Electronically Signed   By: Obie Dredge M.D.   On: 2019/11/24 13:01   Pamella Pert, MD, PhD [facility]  Between 7 am - 7 pm I am available, please contact me via Amion or Securechat  Between 7 pm - 7 am I  am not available, please contact night coverage MD/APP via Amion

## 2019-11-15 NOTE — Progress Notes (Signed)
Pt received IV lasix and only urinated a small amount, pt not taking in much PO. MD made aware.

## 2019-11-15 NOTE — Progress Notes (Signed)
Pt O2 Sats are 87% on non rebreather and [place] HF at 15L, pt having trouble laying flat at this time. Will try to get pt to CT this morning when able, MD notified

## 2019-11-16 ENCOUNTER — Inpatient Hospital Stay (HOSPITAL_COMMUNITY): Payer: HRSA Program

## 2019-11-16 DIAGNOSIS — U071 COVID-19: Secondary | ICD-10-CM

## 2019-11-16 DIAGNOSIS — R7989 Other specified abnormal findings of blood chemistry: Secondary | ICD-10-CM

## 2019-11-16 LAB — COMPREHENSIVE METABOLIC PANEL
ALT: 44 U/L (ref 0–44)
AST: 65 U/L — ABNORMAL HIGH (ref 15–41)
Albumin: 2.9 g/dL — ABNORMAL LOW (ref 3.5–5.0)
Alkaline Phosphatase: 84 U/L (ref 38–126)
Anion gap: 10 (ref 5–15)
BUN: 28 mg/dL — ABNORMAL HIGH (ref 8–23)
CO2: 26 mmol/L (ref 22–32)
Calcium: 8.3 mg/dL — ABNORMAL LOW (ref 8.9–10.3)
Chloride: 103 mmol/L (ref 98–111)
Creatinine, Ser: 0.66 mg/dL (ref 0.44–1.00)
GFR calc Af Amer: >60 mL/min (ref >60)
GFR calc non Af Amer: >60 mL/min (ref >60)
Glucose, Bld: 323 mg/dL — ABNORMAL HIGH (ref 70–99)
Potassium: 3.5 mmol/L (ref 3.5–5.1)
Sodium: 139 mmol/L (ref 135–145)
Total Bilirubin: 0.9 mg/dL (ref 0.3–1.2)
Total Protein: 6.8 g/dL (ref 6.5–8.1)

## 2019-11-16 LAB — GLUCOSE, CAPILLARY
Glucose-Capillary: 403 mg/dL — ABNORMAL HIGH (ref 70–99)
Glucose-Capillary: 358 mg/dL — ABNORMAL HIGH (ref 70–99)
Glucose-Capillary: 299 mg/dL — ABNORMAL HIGH (ref 70–99)
Glucose-Capillary: 70 mg/dL (ref 70–99)

## 2019-11-16 LAB — CBC WITH DIFFERENTIAL/PLATELET
Abs Immature Granulocytes: 0.43 10*3/uL — ABNORMAL HIGH (ref 0.00–0.07)
Basophils Absolute: 0.1 10*3/uL (ref 0.0–0.1)
Basophils Relative: 0 %
Eosinophils Absolute: 0 10*3/uL (ref 0.0–0.5)
Eosinophils Relative: 0 %
HCT: 37.5 % (ref 36.0–46.0)
Hemoglobin: 13.3 g/dL (ref 12.0–15.0)
Immature Granulocytes: 2 %
Lymphocytes Relative: 8 %
Lymphs Abs: 1.6 10*3/uL (ref 0.7–4.0)
MCH: 30.5 pg (ref 26.0–34.0)
MCHC: 35.5 g/dL (ref 30.0–36.0)
MCV: 86 fL (ref 80.0–100.0)
Monocytes Absolute: 1.1 10*3/uL — ABNORMAL HIGH (ref 0.1–1.0)
Monocytes Relative: 6 %
Neutro Abs: 16.1 10*3/uL — ABNORMAL HIGH (ref 1.7–7.7)
Neutrophils Relative %: 84 %
Platelets: 213 10*3/uL (ref 150–400)
RBC: 4.36 MIL/uL (ref 3.87–5.11)
RDW: 14 % (ref 11.5–15.5)
WBC: 18.9 10*3/uL — ABNORMAL HIGH (ref 4.0–10.5)
nRBC: 0 % (ref 0.0–0.2)

## 2019-11-16 LAB — BPAM FFP
Blood Product Expiration Date: 202105161130
ISSUE DATE / TIME: 202105151219
Unit Type and Rh: 6200

## 2019-11-16 LAB — D-DIMER, QUANTITATIVE: D-Dimer, Quant: >20 ug/mL-FEU — ABNORMAL HIGH (ref 0.00–0.50)

## 2019-11-16 LAB — C-REACTIVE PROTEIN: CRP: 5.2 mg/dL — ABNORMAL HIGH (ref <1.0)

## 2019-11-16 LAB — PREPARE FRESH FROZEN PLASMA

## 2019-11-16 LAB — HEPARIN LEVEL (UNFRACTIONATED): Heparin Unfractionated: 0.48 IU/mL (ref 0.30–0.70)

## 2019-11-16 MED ORDER — ENOXAPARIN SODIUM 40 MG/0.4ML ~~LOC~~ SOLN
40.0000 mg | Freq: Two times a day (BID) | SUBCUTANEOUS | Status: DC
  Administered 2019-11-16: 40 mg via SUBCUTANEOUS
  Filled 2019-11-16: qty 0.4

## 2019-11-16 MED ORDER — INSULIN DETEMIR 100 UNIT/ML ~~LOC~~ SOLN
14.0000 [IU] | Freq: Two times a day (BID) | SUBCUTANEOUS | Status: DC
  Administered 2019-11-16 – 2019-11-21 (×10): 14 [IU] via SUBCUTANEOUS
  Filled 2019-11-16 (×11): qty 0.14

## 2019-11-16 MED ORDER — INSULIN ASPART 100 UNIT/ML ~~LOC~~ SOLN
3.0000 [IU] | Freq: Three times a day (TID) | SUBCUTANEOUS | Status: DC
  Administered 2019-11-16 – 2019-11-23 (×22): 3 [IU] via SUBCUTANEOUS

## 2019-11-16 MED ORDER — HEPARIN (PORCINE) 25000 UT/250ML-% IV SOLN
1200.0000 [IU]/h | INTRAVENOUS | Status: AC
  Administered 2019-11-16 – 2019-11-17 (×2): 1200 [IU]/h via INTRAVENOUS
  Filled 2019-11-16 (×2): qty 250

## 2019-11-16 NOTE — Progress Notes (Signed)
Pharmacy Brief Note - Anticoagulation Follow Up:  Pt on heparin drip for acute DVT. See note from Perlie Gold, PharmD.  Assessment:  HL = 0.48 is therapeutic on heparin infusion of 1200 units/hr  Confirmed with RN that heparin infusing at correct rate with no issues/interruptions. No signs of bleeding  Goal: Heparin level 0.3 - 0.7  Plan:  Continue heparin infusion at current rate of 1200 units/hr  Check confirmatory HL in 6 hours  CBC, HL daily while on heparin infusion   Cindi Carbon, PharmD 11/16/19 8:53 PM

## 2019-11-16 NOTE — Progress Notes (Signed)
Lower venous duplex       has been completed. Preliminary results can be found under CV proc through chart review. Jeb Levering, BS, RDMS, RVT    Gave results to patient's nurse

## 2019-11-16 NOTE — Progress Notes (Addendum)
PROGRESS NOTE  Katrina Willis JME:[id number] DOB: Apr 29, 1956 DOA: 11/21/2019 PCP: Patient, No Pcp Per   LOS: 2 days   Brief Narrative / Interim history: 64 year old female with history of diabetes mellitus came into the hospital with shortness of breath, was diagnosed with Covid 19 pneumonia and hypoxic respiratory failure and was admitted to stepdown on 5/14.  Chest x-ray on admission showed multifocal pneumonia.  Hypoxia was profound on admission requiring nonrebreather/15 L  Subjective / 24h Interval events: Subjectively she is feeling better however still on 15 L nonrebreather.  Denies any chest pain.  No abdominal pain, no nausea or vomiting.  Gets short winded with minimal activity  Assessment & Plan: Principal Problem Acute hypoxic respiratory failure due to COVID-19 viral pneumonia /ARDS -Continues to remain profoundly hypoxic without much improvement in her oxygen requirements although subjectively she is feeling better.  She is mentating well.  Remains high risk for intubation, will monitor closely for decreased mentation as well as signs of respiratory distress with nasal flaring, tachypnea -OK with mild hypoxemia, goal at rest is > 85% SaO2, with movement ideally > 75% -encouraged the patient to sit up in chair in the daytime use I-S and flutter valve for pulmonary toiletry and then prone in bed when at night. -Getting remdesivir today day 3/5, started hydroxychloroquine on 5/15 at family's insistence -status post Actemra 5/14, also received convalescent plasma on 5/15 -D-dimer elevated, CT angiogram negative.  Obtain lower extremity ultrasound & increase Lovenox to intermediate dosing  COVID-19 Labs  Recent Labs    11/04/2019 1240 11/10/2019 1240 11/10/2019 1407 11/26/2019 1821 11/15/19 0238 11/16/19 0309  DDIMER  --   --  3.50*  --  3.99* >20.00*  FERRITIN 530*  --   --   --   --   --   LDH  --   --  541*  --   --   --   CRP 16.4*   < >  --  16.2* 13.8* 5.2*   < > = values  in this interval not displayed.    Lab Results  Component Value Date   SARSCOV2NAA POSITIVE (A) 11/27/2019   Active Problems Addendum: Acute DVT -LE doppler US positive for DVT. Start heparin  Type 2 diabetes mellitus, poorly controlled, with hyperglycemia-her hemoglobin A1c is 7.9.  Her CBGs are worse in the setting of COVID-19 along with steroids.   -Increase Levemir to 14 units twice daily today, also add scheduled mealtime in addition to her sliding scale  Scheduled Meds: . sodium chloride   Intravenous Once  . albuterol  2 puff Inhalation Q6H  . vitamin C  500 mg Oral Daily  . Chlorhexidine Gluconate Cloth  6 each Topical Daily  . enoxaparin (LOVENOX) injection  40 mg Subcutaneous Q12H  . hydroxychloroquine  400 mg Oral BID   Followed by  . hydroxychloroquine  200 mg Oral BID  . insulin aspart  0-15 Units Subcutaneous TID WC  . insulin aspart  0-5 Units Subcutaneous QHS  . insulin aspart  3 Units Subcutaneous TID WC  . insulin detemir  14 Units Subcutaneous BID  . linagliptin  5 mg Oral Daily  . mouth rinse  15 mL Mouth Rinse BID  . methylPREDNISolone (SOLU-MEDROL) injection  0.5 mg/kg Intravenous Q12H  . zinc sulfate  220 mg Oral Daily   Continuous Infusions: . remdesivir 100 mg in NS 100 mL Stopped (11/15/19 0922)   PRN Meds:.acetaminophen, chlorpheniramine-HYDROcodone, guaiFENesin-dextromethorphan, ondansetron **OR** ondansetron (ZOFRAN) IV  DVT prophylaxis:  Lovenox Code Status: Full code Family Communication: Discussed with patient's brother Madlyn Frankel [phone], who is an anesthesiologist  Status is: Inpatient  Remains inpatient appropriate because:Inpatient level of care appropriate due to severity of illness  Dispo: The patient is from: Home              Anticipated d/c is to: Home              Anticipated d/c date is: > 3 days              Patient currently is not medically stable to d/c.  Consultants:  None   Procedures:  none  Microbiology    None   Antimicrobials: None     Objective: Vitals:   11/16/19 0300 11/16/19 0400 11/16/19 0403 11/16/19 0511  BP:   (!) 119/38 (!) 126/55  Pulse: (!) 59 (!) 56 (!) 57 60  Resp: 17 14 18 20   Temp:  (!) 96.6 F (35.9 C)    TempSrc:  Axillary    SpO2: (!) 87% (!) 87% (!) 85% 92%  Weight:      Height:        Intake/Output Summary (Last 24 hours) at 11/16/2019 0912 Last data filed at 11/16/2019 0500 Gross per 24 hour  Intake 1266.65 ml  Output --  Net 1266.65 ml   Filed Weights   11/10/2019 1402 11/16/2019 2106  Weight: 72.6 kg 82.2 kg    Examination:  Constitutional: Remains tachypneic Eyes: No icterus seen ENMT: Moist external drains Neck: normal, supple Respiratory: Diffuse bibasilar rhonchi, no wheezing, no crackles, increased respiratory effort, tachypnea Cardiovascular: Regular, no murmurs heard.  No edema Abdomen: Soft, nontender, nondistended, bowel sounds positive Musculoskeletal: no clubbing / cyanosis.  Skin: No rashes appreciated Neurologic: No focal deficits Psychiatric: Alert and oriented x3.  Appropriate, alert   Data Reviewed: I have independently reviewed following labs and imaging studies   CBC: Recent Labs  Lab 11/17/2019 1407 11/15/19 0238 11/16/19 0309  WBC 12.5* 9.7 18.9*  NEUTROABS 10.2* 8.3* 16.1*  HGB 14.5 13.6 13.3  HCT 40.6 38.5 37.5  MCV 85.5 85.9 86.0  PLT 203 206 213   Basic Metabolic Panel: Recent Labs  Lab 11/04/2019 1407 11/15/19 0238 11/16/19 0309  NA 133* 136 139  K 3.7 3.5 3.5  CL 96* 103 103  CO2 22 24 26   GLUCOSE 392* 356* 323*  BUN 16 24* 28*  CREATININE 0.77 0.76 0.66  CALCIUM 8.0* 8.4* 8.3*   Liver Function Tests: Recent Labs  Lab 11/11/2019 1407 11/15/19 0238 11/16/19 0309  AST 46* 43* 65*  ALT 28 29 44  ALKPHOS 67 65 84  BILITOT 1.0 0.9 0.9  PROT 7.5 7.1 6.8  ALBUMIN 3.1* 2.9* 2.9*   Coagulation Profile: No results for input(s): INR, PROTIME in the last 168 hours. HbA1C: Recent Labs     11/08/2019 1821 11/15/19 0238  HGBA1C 7.9* 8.1*   CBG: Recent Labs  Lab 11/15/19 0448 11/15/19 0822 11/15/19 1231 11/15/19 1643 11/15/19 2117  GLUCAP 266* 213* 316* 337* 271*    Recent Results (from the past 240 hour(s))  Blood Culture (routine x 2)     Status: None (Preliminary result)   Collection Time: 11/03/2019 12:40 PM   Specimen: BLOOD  Result Value Ref Range Status   Specimen Description   Final    BLOOD RIGHT ANTECUBITAL Performed at [facility], [address] [place], [place]    Special Requests   Final  BOTTLES DRAWN AEROBIC AND ANAEROBIC Blood Culture adequate volume Performed at [facility] [address] [place], [place] [address]    Culture   Final    NO GROWTH 2 DAYS Performed at [facility] [address] [place], [place] [address]    Report Status PENDING  Incomplete  Blood Culture (routine x 2)     Status: None (Preliminary result)   Collection Time: 12/10/19 12:45 PM   Specimen: BLOOD  Result Value Ref Range Status   Specimen Description   Final    BLOOD RIGHT ANTECUBITAL Performed at [facility] [address], [place], [place] [address]    Special Requests   Final    BOTTLES DRAWN AEROBIC AND ANAEROBIC Blood Culture adequate volume Performed at [facility] [address], [place], [place] [address]    Culture   Final    NO GROWTH 2 DAYS Performed at [facility] [address], [place], [place] [address]    Report Status PENDING  Incomplete  SARS CORONAVIRUS 2 (TAT 6-24 HRS) Nasopharyngeal Nasopharyngeal Swab     Status: Abnormal   Collection Time: 10-Dec-2019  2:07 PM   Specimen: Nasopharyngeal Swab  Result Value Ref Range Status   SARS Coronavirus 2 POSITIVE (A) NEGATIVE Final    Comment: RESULT CALLED TO, READ BACK BY AND VERIFIED WITH: RN GARCIA KELLY AT 4193 BY MESSAN HOUEGNIFIO ON 11/15/2019 (NOTE) SARS-CoV-2 target nucleic  acids are DETECTED. The SARS-CoV-2 RNA is generally detectable in upper and lower respiratory specimens during the acute phase of infection. Positive results are indicative of the presence of SARS-CoV-2 RNA. Clinical correlation with patient history and other diagnostic information is  necessary to determine patient infection status. Positive results do not rule out bacterial infection or co-infection with other viruses.  The expected result is Negative. Fact Sheet for Patients: SugarRoll.be Fact Sheet for Healthcare Providers: https://www.woods-mathews.com/ This test is not yet approved or cleared by the [place] FDA and  has been authorized for detection and/or diagnosis of SARS-CoV-2 by FDA under an Emergency Use Authorization (EUA). This EUA will remain  in effect (meaning this tes t can be used) for the duration of the COVID-19 declaration under Section 564(b)(1) of the Act, 21 U.S.C. section 360bbb-3(b)(1), unless the authorization is terminated or revoked sooner. Performed at [facility], [place] [address], [place], [place] [address]   MRSA PCR Screening     Status: None   Collection Time: 11/15/19  1:06 AM   Specimen: Nasal Mucosa; Nasopharyngeal  Result Value Ref Range Status   MRSA by PCR NEGATIVE NEGATIVE Final    Comment:        The GeneXpert MRSA Assay (FDA approved for NASAL specimens only), is one component of a comprehensive MRSA colonization surveillance program. It is not intended to diagnose MRSA infection nor to guide or monitor treatment for MRSA infections. Performed at [facility], [address] [place], [place] [address]      Radiology Studies: CT ANGIO CHEST PE W OR WO CONTRAST  Result Date: 11/15/2019 CLINICAL DATA:  COVID positive with increasing shortness of breath. Rising D-dimer. EXAM: CT ANGIOGRAPHY CHEST WITH CONTRAST TECHNIQUE: Multidetector CT imaging of the chest was  performed using the standard protocol during bolus administration of intravenous contrast. Multiplanar CT image reconstructions and MIPs were obtained to evaluate the vascular anatomy. CONTRAST:  111mL OMNIPAQUE IOHEXOL 350 MG/ML SOLN COMPARISON:  Radiograph yesterday. FINDINGS: Cardiovascular:  Motion artifact limits assessment distal to the segmental branches. There are no central filling defects in the pulmonary arteries to suggest pulmonary embolus. Distal branches are not well assessed given motion. Aortic atherosclerosis without aneurysm or dissection. Upper normal heart size. No pericardial effusion. Mediastinum/Nodes: Few shotty mediastinal and hilar lymph nodes that are not enlarged by size criteria. No thyroid nodule. The esophagus is patulous without wall thickening. Lungs/Pleura: Widespread bilateral ground-glass opacities throughout both lungs with slight apical sparing. Scattered superimposed consolidations in the bases. No pleural fluid. No pneumothorax. Upper Abdomen: No acute findings. Musculoskeletal: There are no acute or suspicious osseous abnormalities. Review of the MIP images confirms the above findings. IMPRESSION: 1. No central pulmonary embolus. Motion artifact limits assessment distal to the segmental branches. 2. Widespread bilateral ground-glass opacities throughout both lungs, pattern most consistent with COVID-19 pneumonia. Parenchymal involvement is extensive. Aortic Atherosclerosis (ICD10-I70.0). Electronically Signed   By: Narda Rutherford M.D.   On: 11/15/2019 17:37   Pamella Pert, MD, PhD [facility]  Between 7 am - 7 pm I am available, please contact me via Amion or Securechat  Between 7 pm - 7 am I am not available, please contact night coverage MD/APP via Amion

## 2019-11-16 NOTE — Progress Notes (Signed)
ANTICOAGULATION CONSULT NOTE  Pharmacy Consult for Heparin Indication: DVT  Allergies  Allergen Reactions  . Janumet [Sitagliptin-Metformin Hcl] Diarrhea and Nausea And Vomiting    Intolerance    Patient Measurements: Height: 5\' 4"  (162.6 cm) Weight: 82.2 kg (181 lb 3.5 oz) IBW/kg (Calculated) : 54.7 Heparin Dosing Weight: 72.5,g  Vital Signs: Temp: 98.1 F (36.7 C) (05/16 1011) Temp Source: Oral (05/16 1011) BP: 138/65 (05/16 0800) Pulse Rate: 61 (05/16 0800)  Labs: Recent Labs    Nov 21, 2019 1407 21-Nov-2019 1407 2019/11/21 1502 11/15/19 0238 11/16/19 0309  HGB 14.5   < >  --  13.6 13.3  HCT 40.6  --   --  38.5 37.5  PLT 203  --   --  206 213  CREATININE 0.77  --   --  0.76 0.66  TROPONINIHS 75*  --  86*  --   --    < > = values in this interval not displayed.   Estimated Creatinine Clearance: 74.7 mL/min (by C-G formula based on SCr of 0.66 mg/dL).  Medical History: Past Medical History:  Diagnosis Date  . Diabetes mellitus without complication (HCC)    Medications:  Scheduled:  . sodium chloride   Intravenous Once  . albuterol  2 puff Inhalation Q6H  . vitamin C  500 mg Oral Daily  . Chlorhexidine Gluconate Cloth  6 each Topical Daily  . hydroxychloroquine  200 mg Oral BID  . insulin aspart  0-15 Units Subcutaneous TID WC  . insulin aspart  0-5 Units Subcutaneous QHS  . insulin aspart  3 Units Subcutaneous TID WC  . insulin detemir  14 Units Subcutaneous BID  . linagliptin  5 mg Oral Daily  . mouth rinse  15 mL Mouth Rinse BID  . methylPREDNISolone (SOLU-MEDROL) injection  0.5 mg/kg Intravenous Q12H  . zinc sulfate  220 mg Oral Daily   Infusions:  . remdesivir 100 mg in NS 100 mL 100 mg (11/16/19 1047)    Assessment: 63 yoF admit 5/14 with [facility], COVID+ on 5/13 as outpatient. Ddimer 3.5 on admit, started on Lovenox 40mg  SQ qd for VTE prophylaxis, incr to bid with Ddimer greatly elevated to > 20 on 5/16 am. LLE VTE per doppler 5/16 Last Lovenox 40mg  on  5/16 at 10:40 am, discontinued Begin Heparin infusion, no load with recent Lovenox  Goal of Therapy:  Heparin level 0.3-0.7 units/ml Monitor platelets by anticoagulation protocol: Yes   Plan:  Begin Heparin infusion at 1200 units/hr Check 6 hr Hep level Daily CBC while on Heparin, order daily Hep level at steady state Follow up for decision on oral anti-coagulation  6/16 PharmD 11/16/2019,11:08 AM

## 2019-11-17 LAB — COMPREHENSIVE METABOLIC PANEL
ALT: 43 U/L (ref 0–44)
AST: 54 U/L — ABNORMAL HIGH (ref 15–41)
Albumin: 2.7 g/dL — ABNORMAL LOW (ref 3.5–5.0)
Alkaline Phosphatase: 101 U/L (ref 38–126)
Anion gap: 10 (ref 5–15)
BUN: 27 mg/dL — ABNORMAL HIGH (ref 8–23)
CO2: 25 mmol/L (ref 22–32)
Calcium: 8.4 mg/dL — ABNORMAL LOW (ref 8.9–10.3)
Chloride: 107 mmol/L (ref 98–111)
Creatinine, Ser: 0.65 mg/dL (ref 0.44–1.00)
GFR calc Af Amer: >60 mL/min (ref >60)
GFR calc non Af Amer: >60 mL/min (ref >60)
Glucose, Bld: 127 mg/dL — ABNORMAL HIGH (ref 70–99)
Potassium: 3.7 mmol/L (ref 3.5–5.1)
Sodium: 142 mmol/L (ref 135–145)
Total Bilirubin: 0.8 mg/dL (ref 0.3–1.2)
Total Protein: 6.4 g/dL — ABNORMAL LOW (ref 6.5–8.1)

## 2019-11-17 LAB — GLUCOSE, CAPILLARY
Glucose-Capillary: 213 mg/dL — ABNORMAL HIGH (ref 70–99)
Glucose-Capillary: 383 mg/dL — ABNORMAL HIGH (ref 70–99)
Glucose-Capillary: 192 mg/dL — ABNORMAL HIGH (ref 70–99)
Glucose-Capillary: 216 mg/dL — ABNORMAL HIGH (ref 70–99)

## 2019-11-17 LAB — CBC WITH DIFFERENTIAL/PLATELET
Abs Immature Granulocytes: 0.44 10*3/uL — ABNORMAL HIGH (ref 0.00–0.07)
Basophils Absolute: 0.1 10*3/uL (ref 0.0–0.1)
Basophils Relative: 1 %
Eosinophils Absolute: 0 10*3/uL (ref 0.0–0.5)
Eosinophils Relative: 0 %
HCT: 37.3 % (ref 36.0–46.0)
Hemoglobin: 13.2 g/dL (ref 12.0–15.0)
Immature Granulocytes: 3 %
Lymphocytes Relative: 10 %
Lymphs Abs: 1.5 10*3/uL (ref 0.7–4.0)
MCH: 30.4 pg (ref 26.0–34.0)
MCHC: 35.4 g/dL (ref 30.0–36.0)
MCV: 85.9 fL (ref 80.0–100.0)
Monocytes Absolute: 0.8 10*3/uL (ref 0.1–1.0)
Monocytes Relative: 5 %
Neutro Abs: 13.3 10*3/uL — ABNORMAL HIGH (ref 1.7–7.7)
Neutrophils Relative %: 81 %
Platelets: 197 10*3/uL (ref 150–400)
RBC: 4.34 MIL/uL (ref 3.87–5.11)
RDW: 14.1 % (ref 11.5–15.5)
WBC: 16.2 10*3/uL — ABNORMAL HIGH (ref 4.0–10.5)
nRBC: 0.1 % (ref 0.0–0.2)

## 2019-11-17 LAB — C-REACTIVE PROTEIN: CRP: 1.8 mg/dL — ABNORMAL HIGH (ref <1.0)

## 2019-11-17 LAB — D-DIMER, QUANTITATIVE: D-Dimer, Quant: >20 ug/mL-FEU — ABNORMAL HIGH (ref 0.00–0.50)

## 2019-11-17 LAB — HEPARIN LEVEL (UNFRACTIONATED): Heparin Unfractionated: 0.5 IU/mL (ref 0.30–0.70)

## 2019-11-17 MED ORDER — ENOXAPARIN SODIUM 80 MG/0.8ML ~~LOC~~ SOLN
1.0000 mg/kg | Freq: Two times a day (BID) | SUBCUTANEOUS | Status: DC
  Administered 2019-11-17 – 2019-11-22 (×11): 80 mg via SUBCUTANEOUS
  Filled 2019-11-17 (×13): qty 0.8

## 2019-11-17 NOTE — Progress Notes (Signed)
Pharmacy: [facility]  Patient is a 64 y.o F with COVID presented to the ED on 5/14 with c/o SOB, cough, HA and generalized weakness. Chest CTA on 5/15 was negative for PE. D-dimer was >20 on 5/16 with LE doppler showing acute DVT involving the left gastrocnemius veins. She was transitioned from prophylaxis dose LMWH to full dose heparin drop on 5/16.  - heparin level is therapeutic at 0.50 - cbc stable  Goal of Therapy:  Heparin level 0.3-0.7 units/ml Monitor platelets by anticoagulation protocol: Yes  Plan: - continue heparin drip at 1200 units/hr - daily heparin level  - monitor for s/s bleeding  Dorna Leitz, PharmD, BCPS 11/17/2019 3:28 AM

## 2019-11-17 NOTE — Progress Notes (Signed)
PROGRESS NOTE  Katrina Willis KGM:[id number] DOB: 07-17-1955 DOA: 11/18/2019 PCP: Patient, No Pcp Per   LOS: 3 days   Brief Narrative / Interim history: 64 year old female with history of diabetes mellitus came into the hospital with shortness of breath, was diagnosed with Covid 19 pneumonia and hypoxic respiratory failure and was admitted to stepdown on 5/14.  Chest x-ray on admission showed multifocal pneumonia.  Hypoxia was profound on admission requiring nonrebreather/15 L  Subjective / 24h Interval events: Subjectively states that she is feeling better than when she came in on Friday.  Complains of chest pain with deep inspiration.  Complains of shortness of breath with activity.  Assessment & Plan: Principal Problem Acute hypoxic respiratory failure due to COVID-19 viral pneumonia /ARDS -Remains profoundly hypoxic, currently on heated high flow 35% liters 1% FiO2 and nonrebreather mask on top for comfort.  Remains high risk for intubation -OK with mild hypoxemia, goal at rest is > 85% SaO2, with movement ideally > 75% -encouraged the patient to sit up in chair in the daytime use I-S and flutter valve for pulmonary toiletry and then prone in bed when at night. -Getting remdesivir today day 4/5, started hydroxychloroquine on 5/15 at family's insistence -status post Actemra 5/14, also received convalescent plasma on 5/15 -Inflammatory markers improving  FiO2 (%):  [100 %] 100 %  COVID-19 Labs  Recent Labs     0000 11/10/2019 1240 11/28/2019 1407 11/17/2019 1821 11/15/19 0238 11/16/19 0309 11/17/19 0224  DDIMER   < >  --  3.50*  --  3.99* >20.00* >20.00*  FERRITIN  --  530*  --   --   --   --   --   LDH  --   --  541*  --   --   --   --   CRP  --  16.4*  --    < > 13.8* 5.2* 1.8*   < > = values in this interval not displayed.    Lab Results  Component Value Date   SARSCOV2NAA POSITIVE (A) 11/13/2019   Active Problems Acute DVT -LE doppler US positive for DVT on 5/16.  She  was started on heparin 5/16, no evidence of bleeding, hemoglobin stable, convert to Lovenox today  Type 2 diabetes mellitus, poorly controlled, with hyperglycemia-her hemoglobin A1c is 7.9.  Her CBGs are worse in the setting of COVID-19 along with steroids.   -Increase Levemir to 14 units twice daily today, also add scheduled mealtime in addition to her sliding scale.  CBGs overall much better  Scheduled Meds: . sodium chloride   Intravenous Once  . albuterol  2 puff Inhalation Q6H  . vitamin C  500 mg Oral Daily  . Chlorhexidine Gluconate Cloth  6 each Topical Daily  . hydroxychloroquine  200 mg Oral BID  . insulin aspart  0-15 Units Subcutaneous TID WC  . insulin aspart  0-5 Units Subcutaneous QHS  . insulin aspart  3 Units Subcutaneous TID WC  . insulin detemir  14 Units Subcutaneous BID  . linagliptin  5 mg Oral Daily  . mouth rinse  15 mL Mouth Rinse BID  . methylPREDNISolone (SOLU-MEDROL) injection  0.5 mg/kg Intravenous Q12H  . zinc sulfate  220 mg Oral Daily   Continuous Infusions: . heparin 1,200 Units/hr (11/17/19 0318)  . remdesivir 100 mg in NS 100 mL 100 mg (11/17/19 1004)   PRN Meds:.acetaminophen, chlorpheniramine-HYDROcodone, guaiFENesin-dextromethorphan, ondansetron **OR** ondansetron (ZOFRAN) IV  DVT prophylaxis: Lovenox Code Status: Full code Family Communication:  Discussed with patient's brother Wynema Birch [phone], who is an anesthesiologist as well as patient's friend Jenny Reichmann  Status is: Inpatient  Remains inpatient appropriate because:Inpatient level of care appropriate due to severity of illness  Dispo: The patient is from: Home              Anticipated d/c is to: Home              Anticipated d/c date is: > 3 days              Patient currently is not medically stable to d/c.  Consultants:  None   Procedures:  none  Microbiology  None   Antimicrobials: None     Objective: Vitals:   11/17/19 0323 11/17/19 0400 11/17/19 0800 11/17/19  0904  BP:  (!) 101/38    Pulse:  (!) 58    Resp:  (!) 24    Temp:   (!) 97.4 F (36.3 C)   TempSrc:   Axillary   SpO2: 93% 91%  94%  Weight:      Height:        Intake/Output Summary (Last 24 hours) at 11/17/2019 1101 Last data filed at 11/17/2019 0318 Gross per 24 hour  Intake 265.45 ml  Output 600 ml  Net -334.55 ml   Filed Weights   11/15/2019 1402 11/05/2019 2106  Weight: 72.6 kg 82.2 kg    Examination:  Constitutional: Appears slightly more comfortable Eyes: No scleral icterus ENMT: Moist mucous membranes Neck: normal, supple Respiratory: Diffuse bibasilar rhonchi, no wheezing, increased respiratory effort, tachypneic Cardiovascular: Regular rate and rhythm, no murmurs heard, no peripheral edema Abdomen: Soft, nontender, nondistended, bowel sounds positive Musculoskeletal: no clubbing / cyanosis.  Skin: No rashes seen Neurologic: Nonfocal, equal strength Psychiatric: Alert and oriented x4  Data Reviewed: I have independently reviewed following labs and imaging studies   CBC: Recent Labs  Lab 11/21/2019 1407 11/15/19 0238 11/16/19 0309 11/17/19 0224  WBC 12.5* 9.7 18.9* 16.2*  NEUTROABS 10.2* 8.3* 16.1* 13.3*  HGB 14.5 13.6 13.3 13.2  HCT 40.6 38.5 37.5 37.3  MCV 85.5 85.9 86.0 85.9  PLT 203 206 213 175   Basic Metabolic Panel: Recent Labs  Lab 11/25/2019 1407 11/15/19 0238 11/16/19 0309 11/17/19 0224  NA 133* 136 139 142  K 3.7 3.5 3.5 3.7  CL 96* 103 103 107  CO2 22 24 26 25   GLUCOSE 392* 356* 323* 127*  BUN 16 24* 28* 27*  CREATININE 0.77 0.76 0.66 0.65  CALCIUM 8.0* 8.4* 8.3* 8.4*   Liver Function Tests: Recent Labs  Lab 11/05/2019 1407 11/15/19 0238 11/16/19 0309 11/17/19 0224  AST 46* 43* 65* 54*  ALT 28 29 44 43  ALKPHOS 67 65 84 101  BILITOT 1.0 0.9 0.9 0.8  PROT 7.5 7.1 6.8 6.4*  ALBUMIN 3.1* 2.9* 2.9* 2.7*   Coagulation Profile: No results for input(s): INR, PROTIME in the last 168 hours. HbA1C: Recent Labs    11/25/2019 1821  11/15/19 0238  HGBA1C 7.9* 8.1*   CBG: Recent Labs  Lab 11/16/19 0829 11/16/19 1312 11/16/19 1616 11/16/19 2133 11/17/19 0831  GLUCAP 403* 358* 299* 70 213*    Recent Results (from the past 240 hour(s))  Blood Culture (routine x 2)     Status: None (Preliminary result)   Collection Time: 11/22/2019 12:40 PM   Specimen: BLOOD  Result Value Ref Range Status   Specimen Description   Final    BLOOD RIGHT ANTECUBITAL Performed at [facility]  [facility], [address], [place], [place] [address]    Special Requests   Final    BOTTLES DRAWN AEROBIC AND ANAEROBIC Blood Culture adequate volume Performed at [facility], [address] [place], [place] [address]    Culture   Final    NO GROWTH 3 DAYS Performed at [facility], [address], [place], [place] [address]    Report Status PENDING  Incomplete  Blood Culture (routine x 2)     Status: None (Preliminary result)   Collection Time: 11-22-2019 12:45 PM   Specimen: BLOOD  Result Value Ref Range Status   Specimen Description   Final    BLOOD RIGHT ANTECUBITAL Performed at [facility], [address], [place], [place] [address]    Special Requests   Final    BOTTLES DRAWN AEROBIC AND ANAEROBIC Blood Culture adequate volume Performed at [facility], [address] [place], [place] [address]    Culture   Final    NO GROWTH 3 DAYS Performed at [facility], [address] [place], [place] [address]    Report Status PENDING  Incomplete  SARS CORONAVIRUS 2 (TAT 6-24 HRS) Nasopharyngeal Nasopharyngeal Swab     Status: Abnormal   Collection Time: 11/22/19  2:07 PM   Specimen: Nasopharyngeal Swab  Result Value Ref Range Status   SARS Coronavirus 2 POSITIVE (A) NEGATIVE Final    Comment: RESULT CALLED TO, READ BACK BY AND VERIFIED WITH: RN GARCIA KELLY AT 2315 BY MESSAN HOUEGNIFIO ON 11/15/2019 (NOTE) SARS-CoV-2 target nucleic acids are  DETECTED. The SARS-CoV-2 RNA is generally detectable in upper and lower respiratory specimens during the acute phase of infection. Positive results are indicative of the presence of SARS-CoV-2 RNA. Clinical correlation with patient history and other diagnostic information is  necessary to determine patient infection status. Positive results do not rule out bacterial infection or co-infection with other viruses.  The expected result is Negative. Fact Sheet for Patients: HairSlick.no Fact Sheet for Healthcare Providers: quierodirigir.com This test is not yet approved or cleared by the [place] FDA and  has been authorized for detection and/or diagnosis of SARS-CoV-2 by FDA under an Emergency Use Authorization (EUA). This EUA will remain  in effect (meaning this tes t can be used) for the duration of the COVID-19 declaration under Section 564(b)(1) of the Act, 21 U.S.C. section 360bbb-3(b)(1), unless the authorization is terminated or revoked sooner. Performed at [facility], [address], [place], [place] [address]   MRSA PCR Screening     Status: None   Collection Time: 11/15/19  1:06 AM   Specimen: Nasal Mucosa; Nasopharyngeal  Result Value Ref Range Status   MRSA by PCR NEGATIVE NEGATIVE Final    Comment:        The GeneXpert MRSA Assay (FDA approved for NASAL specimens only), is one component of a comprehensive MRSA colonization surveillance program. It is not intended to diagnose MRSA infection nor to guide or monitor treatment for MRSA infections. Performed at [facility], [address] [place], [place] [address]      Radiology Studies: No results found. Pamella Pert, MD, PhD [facility]  Between 7 am - 7 pm I am available, please contact me via Amion or Securechat  Between 7 pm - 7 am I am not available, please contact night coverage MD/APP via Amion

## 2019-11-17 NOTE — Progress Notes (Signed)
ANTICOAGULATION CONSULT NOTE  Pharmacy Consult for Heparin>>Lovenox Indication: DVT  Allergies  Allergen Reactions  . Janumet [Sitagliptin-Metformin Hcl] Diarrhea and Nausea And Vomiting    Intolerance    Patient Measurements: Height: 5\' 4"  (162.6 cm) Weight: 82.2 kg (181 lb 3.5 oz) IBW/kg (Calculated) : 54.7 Heparin Dosing Weight: 72.5,g  Vital Signs: Temp: 97.9 F (36.6 C) (05/17 1200) Temp Source: Axillary (05/17 1200) BP: 106/56 (05/17 1200) Pulse Rate: 66 (05/17 1200)  Labs: Recent Labs    11/19/2019 1407 11/21/2019 1407 11/02/2019 1502 11/15/19 0238 11/15/19 0238 11/16/19 0309 11/16/19 2015 11/17/19 0224  HGB 14.5   < >  --  13.6   < > 13.3  --  13.2  HCT 40.6   < >  --  38.5  --  37.5  --  37.3  PLT 203   < >  --  206  --  213  --  197  HEPARINUNFRC  --   --   --   --   --   --  0.48 0.50  CREATININE 0.77   < >  --  0.76  --  0.66  --  0.65  TROPONINIHS 75*  --  86*  --   --   --   --   --    < > = values in this interval not displayed.   Estimated Creatinine Clearance: 74.7 mL/min (by C-G formula based on SCr of 0.65 mg/dL).   Assessment: 16 yoF admit 5/14 with [facility], COVID+ on 5/13 as outpatient. Ddimer 3.5 on admit, started on Lovenox 40mg  SQ qd for VTE prophylaxis, incr to bid with Ddimer greatly elevated to > 20 on 5/16 am. LLE VTE per doppler 5/16 11/17/2019  AM heparin level therapeutic at 0.5 on heparin drip at 1200 units/hr CBC WNL, No bleeding reported To transition to Lovenox   Goal of Therapy:  Heparin level 0.3-0.7 units/ml Monitor platelets by anticoagulation protocol: Yes   Plan:  Run heparin drip at 1200 units/hr until 1959 then stop and start lovenox 1 mg/kg sq q12h = 80 q12 F/u ability to transition to DOAC  6/16, Pharm.D [phone] 11/17/2019 1:42 PM

## 2019-11-17 NOTE — Plan of Care (Signed)
  Problem: Clinical Measurements: Goal: Ability to maintain clinical measurements within normal limits will improve Outcome: Progressing Goal: Will remain free from infection Outcome: Progressing   

## 2019-11-17 NOTE — TOC Initial Note (Signed)
Transition of Care [facility]) - Initial/Assessment Note    Patient Details  Name: Katrina Willis MRN: [id number] Date of Birth: 1955-10-21  Transition of Care [facility]) CM/SW Contact:    Golda Acre, RN Phone Number: 11/17/2019, 8:47 AM  Clinical Narrative:                 Covid + patient, o2 hfnc with nonrebreather mask at 35l/min, Iv solu medrol, iv heparin and iv remdesivir.  Expected Discharge Plan: Home/Self Care Barriers to Discharge: Continued Medical Work up   Patient Goals and CMS Choice Patient states their goals for this hospitalization and ongoing recovery are:: to go home CMS Medicare.gov Compare Post Acute Care list provided to:: Patient    Expected Discharge Plan and Services Expected Discharge Plan: Home/Self Care   Discharge Planning Services: CM Consult   Living arrangements for the past 2 months: Single Family Home                                      Prior Living Arrangements/Services Living arrangements for the past 2 months: Single Family Home Lives with:: Spouse Patient language and need for interpreter reviewed:: No Do you feel safe going back to the place where you live?: Yes      Need for Family Participation in Patient Care: Yes (Comment) Care giver support system in place?: Yes (comment)   Criminal Activity/Legal Involvement Pertinent to Current Situation/Hospitalization: No - Comment as needed  Activities of Daily Living Home Assistive Devices/Equipment: None ADL Screening (condition at time of admission) Patient's cognitive ability adequate to safely complete daily activities?: Yes Is the patient deaf or have difficulty hearing?: No Does the patient have difficulty seeing, even when wearing glasses/contacts?: No Does the patient have difficulty concentrating, remembering, or making decisions?: No Patient able to express need for assistance with ADLs?: Yes Does the patient have difficulty dressing or bathing?: No Independently  performs ADLs?: Yes (appropriate for developmental age) Does the patient have difficulty walking or climbing stairs?: Yes(short of breath) Weakness of Legs: Both Weakness of Arms/Hands: Both  Permission Sought/Granted                  Emotional Assessment Appearance:: Appears stated age     Orientation: : Oriented to Self, Oriented to Place, Oriented to  Time, Oriented to Situation Alcohol / Substance Use: Not Applicable Psych Involvement: No (comment)  Admission diagnosis:  SOB (shortness of breath) [R06.02] Acute hypoxemic respiratory failure due to COVID-19 (HCC) [U07.1, J96.01] Patient Active Problem List   Diagnosis Date Noted  . Acute hypoxemic respiratory failure due to COVID-19 [facility]) 11/24/2019   PCP:  Patient, No Pcp Per Pharmacy:   [facility] - [place], [place] - [address] [place] [address] [place] [address] Phone: [phone] Fax: [phone]     Social Determinants of Health (SDOH) Interventions    Readmission Risk Interventions No flowsheet data found.

## 2019-11-18 ENCOUNTER — Inpatient Hospital Stay (HOSPITAL_COMMUNITY): Payer: HRSA Program

## 2019-11-18 LAB — CBC WITH DIFFERENTIAL/PLATELET
Abs Immature Granulocytes: 0.61 10*3/uL — ABNORMAL HIGH (ref 0.00–0.07)
Basophils Absolute: 0.1 10*3/uL (ref 0.0–0.1)
Basophils Relative: 0 %
Eosinophils Absolute: 0 10*3/uL (ref 0.0–0.5)
Eosinophils Relative: 0 %
HCT: 39.1 % (ref 36.0–46.0)
Hemoglobin: 13.8 g/dL (ref 12.0–15.0)
Immature Granulocytes: 4 %
Lymphocytes Relative: 8 %
Lymphs Abs: 1.1 10*3/uL (ref 0.7–4.0)
MCH: 31.2 pg (ref 26.0–34.0)
MCHC: 35.3 g/dL (ref 30.0–36.0)
MCV: 88.3 fL (ref 80.0–100.0)
Monocytes Absolute: 0.4 10*3/uL (ref 0.1–1.0)
Monocytes Relative: 3 %
Neutro Abs: 11.7 10*3/uL — ABNORMAL HIGH (ref 1.7–7.7)
Neutrophils Relative %: 85 %
Platelets: 198 10*3/uL (ref 150–400)
RBC: 4.43 MIL/uL (ref 3.87–5.11)
RDW: 14.6 % (ref 11.5–15.5)
WBC: 13.9 10*3/uL — ABNORMAL HIGH (ref 4.0–10.5)
nRBC: 0.1 % (ref 0.0–0.2)

## 2019-11-18 LAB — COMPREHENSIVE METABOLIC PANEL
ALT: 33 U/L (ref 0–44)
AST: 38 U/L (ref 15–41)
Albumin: 2.7 g/dL — ABNORMAL LOW (ref 3.5–5.0)
Alkaline Phosphatase: 111 U/L (ref 38–126)
Anion gap: 11 (ref 5–15)
BUN: 24 mg/dL — ABNORMAL HIGH (ref 8–23)
CO2: 25 mmol/L (ref 22–32)
Calcium: 8.3 mg/dL — ABNORMAL LOW (ref 8.9–10.3)
Chloride: 105 mmol/L (ref 98–111)
Creatinine, Ser: 0.84 mg/dL (ref 0.44–1.00)
GFR calc Af Amer: >60 mL/min (ref >60)
GFR calc non Af Amer: >60 mL/min (ref >60)
Glucose, Bld: 322 mg/dL — ABNORMAL HIGH (ref 70–99)
Potassium: 4.7 mmol/L (ref 3.5–5.1)
Sodium: 141 mmol/L (ref 135–145)
Total Bilirubin: 1.3 mg/dL — ABNORMAL HIGH (ref 0.3–1.2)
Total Protein: 6.1 g/dL — ABNORMAL LOW (ref 6.5–8.1)

## 2019-11-18 LAB — D-DIMER, QUANTITATIVE: D-Dimer, Quant: >20 ug/mL-FEU — ABNORMAL HIGH (ref 0.00–0.50)

## 2019-11-18 LAB — C-REACTIVE PROTEIN: CRP: 0.8 mg/dL (ref <1.0)

## 2019-11-18 LAB — GLUCOSE, CAPILLARY
Glucose-Capillary: 289 mg/dL — ABNORMAL HIGH (ref 70–99)
Glucose-Capillary: 228 mg/dL — ABNORMAL HIGH (ref 70–99)
Glucose-Capillary: 133 mg/dL — ABNORMAL HIGH (ref 70–99)
Glucose-Capillary: 166 mg/dL — ABNORMAL HIGH (ref 70–99)

## 2019-11-18 NOTE — Progress Notes (Signed)
sats not holding above 85%.  Dropping below 80.  Added NRB to oxygen hi flow.  sats now 87%.

## 2019-11-18 NOTE — Consult Note (Addendum)
NAME:  Katrina Willis, MRN:  [id number], DOB:  [birth year], LOS: 4 ADMISSION DATE:  11/27/2019, CONSULTATION DATE: 5/18 REFERRING MD: Dr. Cruzita Lederer, CHIEF COMPLAINT: Shortness of breath  Brief History   64 year old female admitted 5/14 with Covid pneumonia and hypoxemic respiratory failure.  History of present illness   64 year old female admitted 5/14 with reports of flulike symptoms.  She lives in [place] as an ex-Patriot and she traveled to the [place] on 5/4 to visit her son in [place].  She reported on admission that for 10 days prior to arrival to the [place] she had been experiencing flu-like symptoms.  Her son is also reportedly sick and in the hospital.  She also reported increased cough, fatigue, weakness and poor PO intake. Her shortness of breath increased in the 24 hours prior to presentation to the point that she could barely get out of bed to take a few steps.  On 5/13 she took a Covid test at [place] which was positive.  In the emergency room she was noted to be tachypneic with saturations in the 60s on room air requiring 15 L nonrebreather.  Chest x-ray was consistent with multifocal pneumonia.  D-dimer 3.5, CRP 16.4, lactic acid 2.4, high-sensitivity troponin 75.  Patient was admitted per [facility] for further care of hypoxemic respiratory failure.  She was treated with convalescent plasma, Actemra and remdesivir. Her brother is apparently an anesthesiologist who requested she receive hydroxychloroquine.  A CT scan of the chest on 5/15 was negative for pulmonary embolism but showed widespread bilateral groundglass opacities consistent with Covid pneumonia.  Further evaluation of LE venous duplex notable for LLE DVT.  She was treated with full dose lovenox.  The patient remained on high flow O2 with primary MD concerns for possible respiratory fatigue.    PCCM consulted for evaluation.   Past Medical History  DM II  Significant Hospital Events   5/14 admit with hypoxemic  respiratory failure in the setting of COVID-19 pneumonia  Consults:  PCCM  Procedures:    Significant Diagnostic Tests:   CTA chest 5/15 >> negative for pulmonary embolism, motion artifact limits assessment distal to the segmental branches.  Widespread bilateral ground groundglass opacities throughout both lungs, pattern most consistent with COVID-19 pneumonia parenchymal involvement is extensive.  Aortic atherosclerosis.  LE Venous Duplex 5/16 >> acute deep vein thrombosis involving the left gastrocnemius veins  Micro Data:  COVID-19 5/14 >> positive MRSA PCR 5/15 >> negative BCx2 5/14 >>   Antimicrobials:  Actemra 5/14 Convalescent Plasma 5/15 Remdesivir 5/14 >> 5/18 Hydroxychloroquine 5/16 (planned 8 doses) >>   Interim history/subjective:  Pt reports she is "doing ok".  Denies pain / SOB.   Afebrile.  Remains on 35L flow, 100% fiO2 via heated high flow nasal cannula   Objective   Blood pressure (!) 119/50, pulse 63, temperature 97.6 F (36.4 C), temperature source Oral, resp. rate 14, height 5\' 4"  (1.626 m), weight 82.2 kg, SpO2 96 %.    FiO2 (%):  [100 %] 100 %   Intake/Output Summary (Last 24 hours) at 11/18/2019 1056 Last data filed at 11/18/2019 0300 Gross per 24 hour  Intake 300.24 ml  Output 25 ml  Net 275.24 ml   Filed Weights   11/22/2019 1402 11/26/2019 2106  Weight: 72.6 kg 82.2 kg    Examination: General: ill appearing adult female lying in bed in NAD   HEENT: MM pink/moist, Kim O2, good dentition, surgical changes in right eye Neuro: AAOx4, speech clear, MAE, non-focal  exam CV: s1s2 RRR, no m/r/g PULM: mild tachypnea but no increase in accessory muscle use / work of breathing, lungs bilaterally clear anterior, diminished bases  GI: soft, bsx4 active  Extremities: warm/dry, no edema  Skin: no rashes or lesions  Resolved Hospital Problem list      Assessment & Plan:   Acute Hypoxic Respiratory Failure secondary to COVID PNA  Completed Actemra  5/14, convalescent plasma 5/15, remdesivir. CTA chest negative for PE but she does have extensive bilateral opacities.   -wean O2 for sats >85%, anticipate she will desaturate with any activity an will be slow to recover.  Will tolerated sats >75% -push prone therapy, up to 16 hours per day  -follow intermittent CXR -educated patient on importance of sitting up in chair / prone positioning  -incentive spirometry / flutter valve  -hydroxychloroquine per family request, follow QTc closely (455 on 5/17) -follow inflammatory markers  -would not intubate patient unless she has change in mental status or work of breathing  LLE DVT  -continue full dose lovenox per pharmacy   DM II  -continue linagliptin  -SSI per primary    Best practice:  Diet: Carb modifed  Pain/Anxiety/Delirium protocol (if indicated): n/a VAP protocol (if indicated): n/a  DVT prophylaxis: full dose lovenox  GI prophylaxis: n/a  Glucose control: SSI  Mobility: As tolerated  Code Status: Full Code. Pt would want intubation if needed.  Family Communication: Patient updated on plan of care 5/18 Disposition: SDU  Labs   CBC: Recent Labs  Lab 11/09/2019 1407 11/15/19 0238 11/16/19 0309 11/17/19 0224 11/18/19 0236  WBC 12.5* 9.7 18.9* 16.2* 13.9*  NEUTROABS 10.2* 8.3* 16.1* 13.3* 11.7*  HGB 14.5 13.6 13.3 13.2 13.8  HCT 40.6 38.5 37.5 37.3 39.1  MCV 85.5 85.9 86.0 85.9 88.3  PLT 203 206 213 197 198    Basic Metabolic Panel: Recent Labs  Lab 11/28/2019 1407 11/15/19 0238 11/16/19 0309 11/17/19 0224 11/18/19 0236  NA 133* 136 139 142 141  K 3.7 3.5 3.5 3.7 4.7  CL 96* 103 103 107 105  CO2 22 24 26 25 25   GLUCOSE 392* 356* 323* 127* 322*  BUN 16 24* 28* 27* 24*  CREATININE 0.77 0.76 0.66 0.65 0.84  CALCIUM 8.0* 8.4* 8.3* 8.4* 8.3*   GFR: Estimated Creatinine Clearance: 71.1 mL/min (by C-G formula based on SCr of 0.84 mg/dL). Recent Labs  Lab 11/02/2019 1407 11/03/2019 1407 11/05/2019 1440 11/15/19 0238  11/16/19 0309 11/17/19 0224 11/18/19 0236  PROCALCITON 0.24  --   --   --   --   --   --   WBC 12.5*   < >  --  9.7 18.9* 16.2* 13.9*  LATICACIDVEN 2.4*  --  2.0*  --   --   --   --    < > = values in this interval not displayed.    Liver Function Tests: Recent Labs  Lab 11/15/2019 1407 11/15/19 0238 11/16/19 0309 11/17/19 0224 11/18/19 0236  AST 46* 43* 65* 54* 38  ALT 28 29 44 43 33  ALKPHOS 67 65 84 101 111  BILITOT 1.0 0.9 0.9 0.8 1.3*  PROT 7.5 7.1 6.8 6.4* 6.1*  ALBUMIN 3.1* 2.9* 2.9* 2.7* 2.7*   No results for input(s): LIPASE, AMYLASE in the last 168 hours. No results for input(s): AMMONIA in the last 168 hours.  ABG    Component Value Date/Time   HCO3 23.2 11/22/2019 1407   ACIDBASEDEF 1.8 11/18/2019 1407   O2SAT  54.2 11/24/2019 1407     Coagulation Profile: No results for input(s): INR, PROTIME in the last 168 hours.  Cardiac Enzymes: No results for input(s): CKTOTAL, CKMB, CKMBINDEX, TROPONINI in the last 168 hours.  HbA1C: Hgb A1c MFr Bld  Date/Time Value Ref Range Status  11/15/2019 02:38 AM 8.1 (H) 4.8 - 5.6 % Final    Comment:    (NOTE) Pre diabetes:          5.7%-6.4% Diabetes:              >6.4% Glycemic control for   <7.0% adults with diabetes   11/04/2019 06:21 PM 7.9 (H) 4.8 - 5.6 % Final    Comment:    (NOTE) Pre diabetes:          5.7%-6.4% Diabetes:              >6.4% Glycemic control for   <7.0% adults with diabetes     CBG: Recent Labs  Lab 11/17/19 0831 11/17/19 1221 11/17/19 1633 11/17/19 2108 11/18/19 0831  GLUCAP 213* 383* 192* 216* 289*    Review of Systems: Positives in [place]  Gen: Denies fever, chills, weight change, fatigue, night sweats HEENT: Denies blurred vision, double vision, hearing loss, tinnitus, sinus congestion, rhinorrhea, sore throat, neck stiffness, dysphagia PULM: Denies shortness of breath, cough, sputum production, hemoptysis, wheezing CV: Denies chest pain, edema, orthopnea, paroxysmal  nocturnal dyspnea, palpitations GI: Denies abdominal pain, nausea, vomiting, diarrhea, hematochezia, melena, constipation, change in bowel habits GU: Denies dysuria, hematuria, polyuria, oliguria, urethral discharge Endocrine: Denies hot or cold intolerance, polyuria, polyphagia or appetite change Derm: Denies rash, dry skin, scaling or peeling skin change Heme: Denies easy bruising, bleeding, bleeding gums Neuro: Denies headache, numbness, weakness, slurred speech, loss of memory or consciousness   Past Medical History  She,  has a past medical history of Diabetes mellitus without complication (HCC).   Surgical History    Past Surgical History:  Procedure Laterality Date  . ABDOMINAL HYSTERECTOMY    . CHOLECYSTECTOMY       Social History   reports that she has never smoked. She has never used smokeless tobacco. She reports previous alcohol use. She reports previous drug use.   Family History   Her family history is not on file.   Allergies Allergies  Allergen Reactions  . Janumet [Sitagliptin-Metformin Hcl] Diarrhea and Nausea And Vomiting    Intolerance     Home Medications  Prior to Admission medications   Medication Sig Start Date End Date Taking? Authorizing Provider  FLUoxetine (PROZAC) 10 MG capsule Take 10 mg by mouth daily.   Yes [provider]  glyBURIDE-metformin (GLUCOVANCE) 5-500 MG tablet Take 1 tablet by mouth in the morning, at noon, and at bedtime. "Maviglin (metformina/glibenclamida)"   Yes [provider]     Critical care time: 35 minutes      Canary Brim, MSN, NP-C  Pulmonary & Critical Care 11/18/2019, 10:56 AM   Please see [email or web address] for pager details.    Attending note: I have seen and examined the patient. History, labs and imaging reviewed.  64 year old with severe Covid pneumonia, ARDS Treated with Actemra, plasma, remdesivir, hydroxychloroquine and steroids On 35% high flow 100% FiO2.  PCCM consulted for  help with management  Examination Deferred due to precautions Overall appears stable, speaking full sentences with no increased work of breathing  Labs/Imaging personally reviewed, significant for Glucose 322, BUN/creatinine 24/0.4, WBC 13.9 Chest x-ray 5/18-bilateral interstitial airspace opacities.  Assessment/plan: ARDS, acute hypoxic  respiratory failure secondary to COVID-19 Prone therapy as tolerated, incentive spirometer, follow chest x-ray Does not need intubation at present Continue monitoring in ICU On Lovenox for lower extremity DVT  The patient is critically ill with multiple organ systems failure and requires high complexity decision making for assessment and support, frequent evaluation and titration of therapies, application of advanced monitoring technologies and extensive interpretation of multiple databases.  Critical care time - 35 mins. This represents my time independent of the NPs time taking care of the pt.  Chilton Greathouse MD [facility] 11/18/2019, 2:33 PM

## 2019-11-18 NOTE — Plan of Care (Signed)
  Problem: Nutrition: Goal: Adequate nutrition will be maintained Outcome: Progressing   

## 2019-11-18 NOTE — Progress Notes (Signed)
PROGRESS NOTE  Katrina Willis HQI:[id number] DOB: 05-Nov-1955 DOA: 11/29/19 PCP: Patient, No Pcp Per   LOS: 4 days   Brief Narrative / Interim history: 64 year old female with history of diabetes mellitus came into the hospital with shortness of breath, was diagnosed with Covid 19 pneumonia and hypoxic respiratory failure and was admitted to stepdown on 5/14.  Chest x-ray on admission showed multifocal pneumonia.  Hypoxia was profound on admission requiring nonrebreather/15 L, received Actemra, convalescent plasma, remdesivir.  She was also placed on hydroxychloroquine at family's insistence  Subjective / 24h Interval events: She tells me that she is tired this morning.  Breathing is about the same.  Chest pain with deep breathing present.  No abdominal pain, no nausea or vomiting.  She has a poor appetite.  Assessment & Plan: Principal Problem Acute hypoxic respiratory failure due to COVID-19 viral pneumonia /ARDS -Remains profoundly hypoxic, currently on heated high flow 35% liters 100% FiO2 and nonrebreather mask on top for comfort.  Remains high risk for intubation, she appears more tired today and more tachypneic than she was in the last couple of days.  I have consulted pulmonology, appreciate input -OK with mild hypoxemia, goal at rest is > 85% SaO2, with movement ideally > 75% -encouraged the patient to sit up in chair in the daytime use I-S and flutter valve for pulmonary toiletry and then prone in bed when at night. -Getting remdesivir today day 5/5, started hydroxychloroquine on 5/15 at family's insistence.  Monitor QTC -status post Actemra 5/14, also received convalescent plasma on 5/15 -Inflammatory markers improving however her ARDS appears quite severe  FiO2 (%):  [100 %] 100 %  COVID-19 Labs  Recent Labs    11/16/19 0309 11/17/19 0224 11/18/19 0236  DDIMER >20.00* >20.00* >20.00*  CRP 5.2* 1.8* 0.8    Lab Results  Component Value Date   SARSCOV2NAA POSITIVE (A)  11-29-2019   Active Problems Acute DVT -LE doppler US positive for DVT on 5/16.  She was started on heparin 5/16, no evidence of bleeding, hemoglobin stable.  She was converted to Lovenox on 5/17  Type 2 diabetes mellitus, poorly controlled, with hyperglycemia-her hemoglobin A1c is 7.9.  Her CBGs are worse in the setting of COVID-19 along with steroids.   -Increase Levemir to 14 units twice daily today, also add scheduled mealtime in addition to her sliding scale.  CBGs acceptable today  CBG (last 3)  Recent Labs    11/17/19 1633 11/17/19 2108 11/18/19 0831  GLUCAP 192* 216* 289*     Scheduled Meds: . albuterol  2 puff Inhalation Q6H  . vitamin C  500 mg Oral Daily  . Chlorhexidine Gluconate Cloth  6 each Topical Daily  . enoxaparin (LOVENOX) injection  1 mg/kg Subcutaneous Q12H  . hydroxychloroquine  200 mg Oral BID  . insulin aspart  0-15 Units Subcutaneous TID WC  . insulin aspart  0-5 Units Subcutaneous QHS  . insulin aspart  3 Units Subcutaneous TID WC  . insulin detemir  14 Units Subcutaneous BID  . linagliptin  5 mg Oral Daily  . mouth rinse  15 mL Mouth Rinse BID  . methylPREDNISolone (SOLU-MEDROL) injection  0.5 mg/kg Intravenous Q12H  . zinc sulfate  220 mg Oral Daily   Continuous Infusions: . remdesivir 100 mg in NS 100 mL 100 mg (11/18/19 0957)   PRN Meds:.acetaminophen, chlorpheniramine-HYDROcodone, guaiFENesin-dextromethorphan, ondansetron **OR** ondansetron (ZOFRAN) IV  DVT prophylaxis: Lovenox Code Status: Full code Family Communication: Discussed with patient's brother Madlyn Frankel [phone],  who is an anesthesiologist as well as patient's friend Jenny Reichmann who lives in [place] area  Status is: Inpatient  Remains inpatient appropriate because:Inpatient level of care appropriate due to severity of illness  Dispo: The patient is from: Home              Anticipated d/c is to: Home              Anticipated d/c date is: > 3 days              Patient  currently is not medically stable to d/c.  Consultants:  Pulmonary critical care  Procedures:  none  Microbiology  None   Antimicrobials: None     Objective: Vitals:   11/18/19 0410 11/18/19 0440 11/18/19 0800 11/18/19 0900  BP: (!) 119/50     Pulse: 62 63    Resp: 20 14    Temp:   97.6 F (36.4 C)   TempSrc:   Oral   SpO2: (!) 81% (!) 87%  96%  Weight:      Height:        Intake/Output Summary (Last 24 hours) at 11/18/2019 1006 Last data filed at 11/18/2019 0300 Gross per 24 hour  Intake 300.24 ml  Output 25 ml  Net 275.24 ml   Filed Weights   December 13, 2019 1402 2019/12/13 2106  Weight: 72.6 kg 82.2 kg    Examination: Constitutional: Appears fatigued, tired Eyes: No icterus seen ENMT: mmm Neck: normal, supple Respiratory: Diffuse bibasilar rhonchi, no wheezing, increased respiratory effort, tachypnea Cardiovascular: Regular rate and rhythm, no murmurs heard.  No peripheral edema Abdomen: Soft, nontender, nondistended, bowel sounds positive Musculoskeletal: no clubbing / cyanosis.  Skin: No rashes seen Neurologic: No focal deficits, equal strength  Data Reviewed: I have independently reviewed following labs and imaging studies   CBC: Recent Labs  Lab Dec 13, 2019 1407 11/15/19 0238 11/16/19 0309 11/17/19 0224 11/18/19 0236  WBC 12.5* 9.7 18.9* 16.2* 13.9*  NEUTROABS 10.2* 8.3* 16.1* 13.3* 11.7*  HGB 14.5 13.6 13.3 13.2 13.8  HCT 40.6 38.5 37.5 37.3 39.1  MCV 85.5 85.9 86.0 85.9 88.3  PLT 203 206 213 197 417   Basic Metabolic Panel: Recent Labs  Lab 12-13-19 1407 11/15/19 0238 11/16/19 0309 11/17/19 0224 11/18/19 0236  NA 133* 136 139 142 141  K 3.7 3.5 3.5 3.7 4.7  CL 96* 103 103 107 105  CO2 22 24 26 25 25   GLUCOSE 392* 356* 323* 127* 322*  BUN 16 24* 28* 27* 24*  CREATININE 0.77 0.76 0.66 0.65 0.84  CALCIUM 8.0* 8.4* 8.3* 8.4* 8.3*   Liver Function Tests: Recent Labs  Lab 2019/12/13 1407 11/15/19 0238 11/16/19 0309 11/17/19 0224  11/18/19 0236  AST 46* 43* 65* 54* 38  ALT 28 29 44 43 33  ALKPHOS 67 65 84 101 111  BILITOT 1.0 0.9 0.9 0.8 1.3*  PROT 7.5 7.1 6.8 6.4* 6.1*  ALBUMIN 3.1* 2.9* 2.9* 2.7* 2.7*   Coagulation Profile: No results for input(s): INR, PROTIME in the last 168 hours. HbA1C: No results for input(s): HGBA1C in the last 72 hours. CBG: Recent Labs  Lab 11/17/19 0831 11/17/19 1221 11/17/19 1633 11/17/19 2108 11/18/19 0831  GLUCAP 213* 383* 192* 216* 289*    Recent Results (from the past 240 hour(s))  Blood Culture (routine x 2)     Status: None (Preliminary result)   Collection Time: 2019-12-13 12:40 PM   Specimen: BLOOD  Result Value Ref Range Status   Specimen Description  Final    BLOOD RIGHT ANTECUBITAL Performed at [facility], [address] [place], [place] [address]    Special Requests   Final    BOTTLES DRAWN AEROBIC AND ANAEROBIC Blood Culture adequate volume Performed at [facility], [address], [place], [place] [address]    Culture   Final    NO GROWTH 4 DAYS Performed at [facility], [address], [place], [place] [address]    Report Status PENDING  Incomplete  Blood Culture (routine x 2)     Status: None (Preliminary result)   Collection Time: 11/12/2019 12:45 PM   Specimen: BLOOD  Result Value Ref Range Status   Specimen Description   Final    BLOOD RIGHT ANTECUBITAL Performed at [facility], [address], [place], [place] [address]    Special Requests   Final    BOTTLES DRAWN AEROBIC AND ANAEROBIC Blood Culture adequate volume Performed at [facility], [address], [place], [place] [address]    Culture   Final    NO GROWTH 4 DAYS Performed at [facility], [address] [place], [place] [address]    Report Status PENDING  Incomplete  SARS CORONAVIRUS 2 (TAT 6-24 HRS) Nasopharyngeal Nasopharyngeal Swab     Status: Abnormal   Collection Time: 11/06/2019   2:07 PM   Specimen: Nasopharyngeal Swab  Result Value Ref Range Status   SARS Coronavirus 2 POSITIVE (A) NEGATIVE Final    Comment: RESULT CALLED TO, READ BACK BY AND VERIFIED WITH: RN GARCIA KELLY AT 2315 BY MESSAN HOUEGNIFIO ON 11/15/2019 (NOTE) SARS-CoV-2 target nucleic acids are DETECTED. The SARS-CoV-2 RNA is generally detectable in upper and lower respiratory specimens during the acute phase of infection. Positive results are indicative of the presence of SARS-CoV-2 RNA. Clinical correlation with patient history and other diagnostic information is  necessary to determine patient infection status. Positive results do not rule out bacterial infection or co-infection with other viruses.  The expected result is Negative. Fact Sheet for Patients: HairSlick.no Fact Sheet for Healthcare Providers: quierodirigir.com This test is not yet approved or cleared by the [place] FDA and  has been authorized for detection and/or diagnosis of SARS-CoV-2 by FDA under an Emergency Use Authorization (EUA). This EUA will remain  in effect (meaning this tes t can be used) for the duration of the COVID-19 declaration under Section 564(b)(1) of the Act, 21 U.S.C. section 360bbb-3(b)(1), unless the authorization is terminated or revoked sooner. Performed at [facility], [address] [place], [place] [address]   MRSA PCR Screening     Status: None   Collection Time: 11/15/19  1:06 AM   Specimen: Nasal Mucosa; Nasopharyngeal  Result Value Ref Range Status   MRSA by PCR NEGATIVE NEGATIVE Final    Comment:        The GeneXpert MRSA Assay (FDA approved for NASAL specimens only), is one component of a comprehensive MRSA colonization surveillance program. It is not intended to diagnose MRSA infection nor to guide or monitor treatment for MRSA infections. Performed at [facility], [address], [place],  [place] [address]      Radiology Studies: No results found. Pamella Pert, MD, PhD [facility]  Between 7 am - 7 pm I am available, please contact me via Amion or Securechat  Between 7 pm - 7 am I am not available, please contact night coverage MD/APP via Amion

## 2019-11-18 NOTE — Progress Notes (Signed)
Refused bath, daily care and CHG bath.  Does not want to get up in chair.

## 2019-11-18 NOTE — Progress Notes (Signed)
Transferred patient from chair to bedside commode and then to bed. Patient stated she had a difficult time going from bed to chair so she had delayed moving back until now. Observed prior to activity that she had not put out significant amount with purwick. Patient was able to void much better on bedside commode. Unable to measure exact amount, but estimate near 500 ml; (Charted as occurrence). Patient tolerated activity without incident. Vitals stable, patient denies feelings of distress or pain, provided cough medicine and something to drink. Patient denied other needs.

## 2019-11-18 NOTE — Progress Notes (Signed)
Pt up to bsc and then chair.  sats dropped to 55 and it took 10 minutes to get up to goal sats of 85%.  Will continue to monitor.

## 2019-11-19 DIAGNOSIS — I82492 Acute embolism and thrombosis of other specified deep vein of left lower extremity: Secondary | ICD-10-CM

## 2019-11-19 DIAGNOSIS — U071 COVID-19: Principal | ICD-10-CM

## 2019-11-19 DIAGNOSIS — J9601 Acute respiratory failure with hypoxia: Secondary | ICD-10-CM

## 2019-11-19 LAB — CBC WITH DIFFERENTIAL/PLATELET
Abs Immature Granulocytes: 0.78 10*3/uL — ABNORMAL HIGH (ref 0.00–0.07)
Basophils Absolute: 0.1 10*3/uL (ref 0.0–0.1)
Basophils Relative: 1 %
Eosinophils Absolute: 0.1 10*3/uL (ref 0.0–0.5)
Eosinophils Relative: 0 %
HCT: 41.5 % (ref 36.0–46.0)
Hemoglobin: 14.3 g/dL (ref 12.0–15.0)
Immature Granulocytes: 5 %
Lymphocytes Relative: 5 %
Lymphs Abs: 0.9 10*3/uL (ref 0.7–4.0)
MCH: 30.2 pg (ref 26.0–34.0)
MCHC: 34.5 g/dL (ref 30.0–36.0)
MCV: 87.7 fL (ref 80.0–100.0)
Monocytes Absolute: 0.4 10*3/uL (ref 0.1–1.0)
Monocytes Relative: 2 %
Neutro Abs: 14.5 10*3/uL — ABNORMAL HIGH (ref 1.7–7.7)
Neutrophils Relative %: 87 %
Platelets: 133 10*3/uL — ABNORMAL LOW (ref 150–400)
RBC: 4.73 MIL/uL (ref 3.87–5.11)
RDW: 14.2 % (ref 11.5–15.5)
WBC: 16.7 10*3/uL — ABNORMAL HIGH (ref 4.0–10.5)
nRBC: 0.1 % (ref 0.0–0.2)

## 2019-11-19 LAB — COMPREHENSIVE METABOLIC PANEL
ALT: 26 U/L (ref 0–44)
AST: 32 U/L (ref 15–41)
Albumin: 2.9 g/dL — ABNORMAL LOW (ref 3.5–5.0)
Alkaline Phosphatase: 144 U/L — ABNORMAL HIGH (ref 38–126)
Anion gap: 14 (ref 5–15)
BUN: 18 mg/dL (ref 8–23)
CO2: 23 mmol/L (ref 22–32)
Calcium: 8.3 mg/dL — ABNORMAL LOW (ref 8.9–10.3)
Chloride: 106 mmol/L (ref 98–111)
Creatinine, Ser: 0.55 mg/dL (ref 0.44–1.00)
GFR calc Af Amer: >60 mL/min (ref >60)
GFR calc non Af Amer: >60 mL/min (ref >60)
Glucose, Bld: 185 mg/dL — ABNORMAL HIGH (ref 70–99)
Potassium: 3.9 mmol/L (ref 3.5–5.1)
Sodium: 143 mmol/L (ref 135–145)
Total Bilirubin: 0.9 mg/dL (ref 0.3–1.2)
Total Protein: 5.9 g/dL — ABNORMAL LOW (ref 6.5–8.1)

## 2019-11-19 LAB — CULTURE, BLOOD (ROUTINE X 2)
Culture: NO GROWTH
Culture: NO GROWTH
Special Requests: ADEQUATE
Special Requests: ADEQUATE

## 2019-11-19 LAB — C-REACTIVE PROTEIN: CRP: 0.7 mg/dL (ref <1.0)

## 2019-11-19 LAB — GLUCOSE, CAPILLARY
Glucose-Capillary: 146 mg/dL — ABNORMAL HIGH (ref 70–99)
Glucose-Capillary: 179 mg/dL — ABNORMAL HIGH (ref 70–99)
Glucose-Capillary: 296 mg/dL — ABNORMAL HIGH (ref 70–99)
Glucose-Capillary: 262 mg/dL — ABNORMAL HIGH (ref 70–99)

## 2019-11-19 LAB — D-DIMER, QUANTITATIVE: D-Dimer, Quant: >20 ug/mL-FEU — ABNORMAL HIGH (ref 0.00–0.50)

## 2019-11-19 MED ORDER — FUROSEMIDE 10 MG/ML IJ SOLN
20.0000 mg | Freq: Once | INTRAMUSCULAR | Status: AC
  Administered 2019-11-19: 20 mg via INTRAVENOUS
  Filled 2019-11-19: qty 2

## 2019-11-19 MED ORDER — IPRATROPIUM-ALBUTEROL 20-100 MCG/ACT IN AERS
1.0000 | INHALATION_SPRAY | Freq: Four times a day (QID) | RESPIRATORY_TRACT | Status: DC
  Administered 2019-11-19 – 2019-11-22 (×13): 1 via RESPIRATORY_TRACT
  Filled 2019-11-19: qty 4

## 2019-11-19 MED ORDER — HYDROCOD POLST-CPM POLST ER 10-8 MG/5ML PO SUER
5.0000 mL | Freq: Two times a day (BID) | ORAL | Status: DC
  Administered 2019-11-19 – 2019-11-22 (×7): 5 mL via ORAL
  Filled 2019-11-19 (×8): qty 5

## 2019-11-19 MED ORDER — SALINE SPRAY 0.65 % NA SOLN
1.0000 | NASAL | Status: DC | PRN
  Administered 2019-11-19: 1 via NASAL
  Filled 2019-11-19: qty 44

## 2019-11-19 MED ORDER — ALBUTEROL SULFATE HFA 108 (90 BASE) MCG/ACT IN AERS: 2.0000 | INHALATION_SPRAY | RESPIRATORY_TRACT | Status: DC | PRN

## 2019-11-19 NOTE — Progress Notes (Signed)
[facility] PCCM Electronic Visit   S:  RN reports pt remains on heated HFNC, flow increased to 40L, FiO2 remains at 100%  O: Blood pressure (!) 142/52, pulse 69, temperature 97.6 F (36.4 C), temperature source Axillary, resp. rate (!) 22, height 5\' 4"  (1.626 m), weight 82.2 kg, SpO2 92 %.   General: disheveled adult female lying in bed prone in NAD Neuro: Moves all extremities, able to turn / self prone CV: tele with SR 68  PULM: tachypnea but non-labored, no increased accessory muscle use, [place] O2   Recent Labs  Lab 11/17/19 0224 11/18/19 0236 11/19/19 0215  HGB 13.2 13.8 14.3  HCT 37.3 39.1 41.5  WBC 16.2* 13.9* 16.7*  PLT 197 198 133*   Recent Labs  Lab 11/15/19 0238 11/15/19 0238 11/16/19 0309 11/16/19 0309 11/17/19 0224 11/17/19 0224 11/18/19 0236 11/19/19 0215  NA 136  --  139  --  142  --  141 143  K 3.5   < > 3.5   < > 3.7   < > 4.7 3.9  CL 103  --  103  --  107  --  105 106  CO2 24  --  26  --  25  --  25 23  GLUCOSE 356*  --  323*  --  127*  --  322* 185*  BUN 24*  --  28*  --  27*  --  24* 18  CREATININE 0.76  --  0.66  --  0.65  --  0.84 0.55  CALCIUM 8.4*  --  8.3*  --  8.4*  --  8.3* 8.3*   < > = values in this interval not displayed.   D-Dimer - remains >20  A: Acute Hypoxic Respiratory Failure in setting of COVID PNA  ARDS LLE DVT  P: -Discussed case with primary MD - Dr. 11/21/19, appreciate care of patient  -Continue O2 for sats >85%, anticipate she will have periods of desaturation with exertion and will tolerate sats of 75% or greater with exertion.  Slow to recover.   -continue plaquenil per family request  -pulmonary hygiene - IS, mobilize -push prone positioning, up to 16 hours per day -would not intubate unless she has mental status change or worsening distress  -follow intermittent CXR  -add nasal saline   PCCM will follow peripherally.  Please call sooner if changes in patients status.  Ella Jubilee, MSN, NP-C [facility] 11/19/2019, 11:19 AM   Please see [email or web address] for pager details.

## 2019-11-19 NOTE — TOC Progression Note (Signed)
Transition of Care [facility]) - Progression Note    Patient Details  Name: ZAYLA AGAR MRN: [id number] Date of Birth: 02-03-1956  Transition of Care [facility]) CM/SW Contact  Golda Acre, RN Phone Number: 11/19/2019, 10:17 AM  Clinical Narrative:    Remains on hfnc at 40l/min, placed in prone position to increase lung volume desat'ing to the low 80's and increased to 95% with proning.   Expected Discharge Plan: Home/Self Care Barriers to Discharge: Continued Medical Work up  Expected Discharge Plan and Services Expected Discharge Plan: Home/Self Care   Discharge Planning Services: CM Consult   Living arrangements for the past 2 months: Single Family Home                                       Social Determinants of Health (SDOH) Interventions    Readmission Risk Interventions No flowsheet data found.

## 2019-11-19 NOTE — Progress Notes (Signed)
PROGRESS NOTE    Katrina Willis  ION:[id number] DOB: 06/03/1956 DOA: 11/05/2019 PCP: Patient, No Pcp Per    Brief Narrative:  Patient admitted to the hospital with the working diagnosis of acute hypoxic respiratory failure due to SARS COVID-19 viral pneumonia.  64 year old female who presented with dyspnea.  She does have significant past medical history for type 2 diabetes mellitus.  She resides in [place] and is visiting [place] since 5/4.  For the last 10 days she has been experiencing flulike symptoms.  Positive fatigue, weakness, and poor appetite.  For the last 24 hours prior to hospitalization she developed significant worsening dyspnea.  She tested positive for COVID-19 11/13/2019. On her initial physical examination blood pressure 123/70, heart rate 77, respiratory rate 32, oxygen saturation 90%, she had bibasilar rhonchi, no wheezing, positive tachypnea and increased work of breathing, heart S1-S2, present rhythmic, abdomen soft, no lower extremity edema. Sodium 133, potassium 3.7, chloride 96, bicarb 22, glucose 392, BUN 16, creatinine 0.77, white count 12.5, hemoglobin 14.5, hematocrit 40.6, platelets 203.  SARS COVID-19 positive.  Her chest radiograph had diffuse bilateral interstitial infiltrates, upper lobes lower lobes, predominantly at bases and peripherally.  CT chest with diffuse bilateral groundglass opacities, more dense opacities at bases bilaterally.  Negative for pulmonary embolism.  EKG 76 bpm, left axis deviation normal intervals, sinus rhythm, no ST segment or T wave changes.  Patient with high oxygen requirements, per non re-breather and heated high flow nasal cannula, she has received Tocilizumab, convalescent plasma, remdesivir and systemic corticosteroids.  Her family has requested hydroxychloroquine.  Patient has continued on very high oxygen requirements and increased work of breathing.   Assessment & Plan:   Active Problems:   Acute hypoxemic respiratory failure due  to COVID-19 (HCC)   1.  Acute hypoxic respiratory failure due to SARS COVID-19 viral pneumonia. Sp Tocilizumab 05/14, covid 19 convalescent plasma 05/15, remdesivir #5.   RR: 19  Pulse oxymetry: 85 to 87%  Fi02: 40 L/ min   COVID-19 Labs  Recent Labs    11/17/19 0224 11/18/19 0236 11/19/19 0215  DDIMER >20.00* >20.00* >20.00*  CRP 1.8* 0.8 0.7    Lab Results  Component Value Date   SARSCOV2NAA POSITIVE (A) 11/25/2019    Patient continue to have very high oxygen requirements, CRP trending down but continue very elevated D dimer. Chest radiograph with improvement in bilateral interstitial infiltrates.   Continue medical therapy with systemic steroids, 36.4 mg IV q 12 H. Continue airway clearing techniques with incentive spirometer and flutter valve, continue bronchodilator therapy and antitussive agents. Prone position as tolerated. Keep oxygen saturation more than 88% and monitor for signs of increase work of breathing.   Continue vitamin C and folic acid. Out of bed to chair tid with meals. Her CT on admission was negative for pulmonary embolism. Will order one dose of furosemide 20 mg to keep negative fluid balance. Follow strict in and out, and inflammatory markers.   2. Acute left DVT. Positive dvt left gastrocnemius, continue full anticoagulation with enoxaparin.    2. Uncontrolled T2DM (Hgb A1c 8,1) with steroid induced hyperglycemia. Fasting glucose 185, patient with poor oral intake. Will continue insulin sliding scale for glucose cover and monitoring, continue basal insulin and trajenda.   3. Obesity class 2. Calculated BMI 31,0, will need outpatient follow up.   Status is: Inpatient  Remains inpatient appropriate because:IV treatments appropriate due to intensity of illness or inability to take PO   Dispo: The patient is from:  Home              Anticipated d/c is to: Home              Anticipated d/c date is: 3 days              Patient currently is not medically  stable to d/c.  Patient critically ill on high oxygen requirements with high risk for worsening.  Critical care time 60 minutes.   DVT prophylaxis: Enoxaparin high dose   Code Status:   full  Family Communication:  No family at the bedside    Consultants:   Pulmonary    Subjective: Patient continue to be fatigued and dyspneic, worse with movement, no nausea or vomiting, poor oral intake.   Objective: Vitals:   11/19/19 0312 11/19/19 0600 11/19/19 0655 11/19/19 0707  BP:   (!) 155/63   Pulse:  65 65   Resp:  (!) 24 19   Temp:    97.6 F (36.4 C)  TempSrc:    Axillary  SpO2: (!) 85% (!) 84% (!) 87%   Weight:      Height:        Intake/Output Summary (Last 24 hours) at 11/19/2019 7341 Last data filed at 11/18/2019 1400 Gross per 24 hour  Intake 100 ml  Output -  Net 100 ml   Filed Weights   11/05/2019 1402 11/28/2019 2106  Weight: 72.6 kg 82.2 kg    Examination:   General: positive dyspnea at rest, deconditioned and ill looking appearing  Neurology: Awake and alert, non focal  E ENT: no pallor, no icterus, oral mucosa moist Cardiovascular: No JVD. S1-S2 present, rhythmic, no gallops, rubs, or murmurs. No lower extremity edema. Pulmonary: positive breath sounds bilaterally, with no wheezing, rhonchi or rales. Gastrointestinal. Abdomen with no organomegaly, non tender, no rebound or guarding Skin. No rashes Musculoskeletal: no joint deformities     Data Reviewed: I have personally reviewed following labs and imaging studies  CBC: Recent Labs  Lab 11/15/19 0238 11/16/19 0309 11/17/19 0224 11/18/19 0236 11/19/19 0215  WBC 9.7 18.9* 16.2* 13.9* 16.7*  NEUTROABS 8.3* 16.1* 13.3* 11.7* 14.5*  HGB 13.6 13.3 13.2 13.8 14.3  HCT 38.5 37.5 37.3 39.1 41.5  MCV 85.9 86.0 85.9 88.3 87.7  PLT 206 213 197 198 937*   Basic Metabolic Panel: Recent Labs  Lab 11/15/19 0238 11/16/19 0309 11/17/19 0224 11/18/19 0236 11/19/19 0215  NA 136 139 142 141 143  K 3.5 3.5  3.7 4.7 3.9  CL 103 103 107 105 106  CO2 24 26 25 25 23   GLUCOSE 356* 323* 127* 322* 185*  BUN 24* 28* 27* 24* 18  CREATININE 0.76 0.66 0.65 0.84 0.55  CALCIUM 8.4* 8.3* 8.4* 8.3* 8.3*   GFR: Estimated Creatinine Clearance: 74.7 mL/min (by C-G formula based on SCr of 0.55 mg/dL). Liver Function Tests: Recent Labs  Lab 11/15/19 0238 11/16/19 0309 11/17/19 0224 11/18/19 0236 11/19/19 0215  AST 43* 65* 54* 38 32  ALT 29 44 43 33 26  ALKPHOS 65 84 101 111 144*  BILITOT 0.9 0.9 0.8 1.3* 0.9  PROT 7.1 6.8 6.4* 6.1* 5.9*  ALBUMIN 2.9* 2.9* 2.7* 2.7* 2.9*   No results for input(s): LIPASE, AMYLASE in the last 168 hours. No results for input(s): AMMONIA in the last 168 hours. Coagulation Profile: No results for input(s): INR, PROTIME in the last 168 hours. Cardiac Enzymes: No results for input(s): CKTOTAL, CKMB, CKMBINDEX, TROPONINI in the last 168  hours. BNP (last 3 results) No results for input(s): PROBNP in the last 8760 hours. HbA1C: No results for input(s): HGBA1C in the last 72 hours. CBG: Recent Labs  Lab 11/18/19 0831 11/18/19 1234 11/18/19 1701 11/18/19 2112 11/19/19 0743  GLUCAP 289* 228* 133* 166* 146*   Lipid Profile: No results for input(s): CHOL, HDL, LDLCALC, TRIG, CHOLHDL, LDLDIRECT in the last 72 hours. Thyroid Function Tests: No results for input(s): TSH, T4TOTAL, FREET4, T3FREE, THYROIDAB in the last 72 hours. Anemia Panel: No results for input(s): VITAMINB12, FOLATE, FERRITIN, TIBC, IRON, RETICCTPCT in the last 72 hours.    Radiology Studies: I have reviewed all of the imaging during this hospital visit personally     Scheduled Meds: . albuterol  2 puff Inhalation Q6H  . vitamin C  500 mg Oral Daily  . Chlorhexidine Gluconate Cloth  6 each Topical Daily  . enoxaparin (LOVENOX) injection  1 mg/kg Subcutaneous Q12H  . hydroxychloroquine  200 mg Oral BID  . insulin aspart  0-15 Units Subcutaneous TID WC  . insulin aspart  0-5 Units  Subcutaneous QHS  . insulin aspart  3 Units Subcutaneous TID WC  . insulin detemir  14 Units Subcutaneous BID  . linagliptin  5 mg Oral Daily  . mouth rinse  15 mL Mouth Rinse BID  . methylPREDNISolone (SOLU-MEDROL) injection  0.5 mg/kg Intravenous Q12H  . zinc sulfate  220 mg Oral Daily   Continuous Infusions:   LOS: 5 days        Mauricio Annett Gula, MD

## 2019-11-19 NOTE — Progress Notes (Signed)
Pt sats 84-85% resting in bed. Encouraged pt to prone and explained the benefits of proning. PT agreeable to prone for one hour or as long as she can tolerate. Sats increased 88-90%. Will continue to monitor closely. MD aware.

## 2019-11-20 DIAGNOSIS — E669 Obesity, unspecified: Secondary | ICD-10-CM

## 2019-11-20 DIAGNOSIS — E1169 Type 2 diabetes mellitus with other specified complication: Secondary | ICD-10-CM

## 2019-11-20 DIAGNOSIS — I824Y2 Acute embolism and thrombosis of unspecified deep veins of left proximal lower extremity: Secondary | ICD-10-CM

## 2019-11-20 DIAGNOSIS — I82409 Acute embolism and thrombosis of unspecified deep veins of unspecified lower extremity: Secondary | ICD-10-CM

## 2019-11-20 DIAGNOSIS — U071 COVID-19: Secondary | ICD-10-CM

## 2019-11-20 LAB — COMPREHENSIVE METABOLIC PANEL
ALT: 20 U/L (ref 0–44)
AST: 28 U/L (ref 15–41)
Albumin: 2.9 g/dL — ABNORMAL LOW (ref 3.5–5.0)
Alkaline Phosphatase: 163 U/L — ABNORMAL HIGH (ref 38–126)
Anion gap: 11 (ref 5–15)
BUN: 19 mg/dL (ref 8–23)
CO2: 26 mmol/L (ref 22–32)
Calcium: 8.1 mg/dL — ABNORMAL LOW (ref 8.9–10.3)
Chloride: 102 mmol/L (ref 98–111)
Creatinine, Ser: 0.49 mg/dL (ref 0.44–1.00)
GFR calc Af Amer: >60 mL/min (ref >60)
GFR calc non Af Amer: >60 mL/min (ref >60)
Glucose, Bld: 164 mg/dL — ABNORMAL HIGH (ref 70–99)
Potassium: 3.2 mmol/L — ABNORMAL LOW (ref 3.5–5.1)
Sodium: 139 mmol/L (ref 135–145)
Total Bilirubin: 1.1 mg/dL (ref 0.3–1.2)
Total Protein: 6 g/dL — ABNORMAL LOW (ref 6.5–8.1)

## 2019-11-20 LAB — C-REACTIVE PROTEIN: CRP: 0.6 mg/dL (ref <1.0)

## 2019-11-20 LAB — FERRITIN: Ferritin: 410 ng/mL — ABNORMAL HIGH (ref 11–307)

## 2019-11-20 LAB — D-DIMER, QUANTITATIVE: D-Dimer, Quant: 19.75 ug/mL-FEU — ABNORMAL HIGH (ref 0.00–0.50)

## 2019-11-20 LAB — GLUCOSE, CAPILLARY
Glucose-Capillary: 180 mg/dL — ABNORMAL HIGH (ref 70–99)
Glucose-Capillary: 316 mg/dL — ABNORMAL HIGH (ref 70–99)
Glucose-Capillary: 184 mg/dL — ABNORMAL HIGH (ref 70–99)
Glucose-Capillary: 78 mg/dL (ref 70–99)

## 2019-11-20 MED ORDER — METHYLPREDNISOLONE SODIUM SUCC 40 MG IJ SOLR
40.0000 mg | Freq: Every day | INTRAMUSCULAR | Status: DC
  Administered 2019-11-21 – 2019-11-22 (×2): 40 mg via INTRAVENOUS
  Filled 2019-11-20 (×2): qty 1

## 2019-11-20 MED ORDER — POTASSIUM CHLORIDE CRYS ER 20 MEQ PO TBCR
20.0000 meq | EXTENDED_RELEASE_TABLET | ORAL | Status: AC
  Administered 2019-11-20 (×2): 20 meq via ORAL
  Filled 2019-11-20 (×2): qty 1

## 2019-11-20 MED ORDER — POTASSIUM CHLORIDE 10 MEQ/100ML IV SOLN
10.0000 meq | INTRAVENOUS | Status: DC
  Administered 2019-11-20 (×2): 10 meq via INTRAVENOUS
  Filled 2019-11-20 (×4): qty 100

## 2019-11-20 MED ORDER — POTASSIUM CHLORIDE CRYS ER 20 MEQ PO TBCR
40.0000 meq | EXTENDED_RELEASE_TABLET | Freq: Once | ORAL | Status: AC
  Administered 2019-11-20: 40 meq via ORAL
  Filled 2019-11-20: qty 2

## 2019-11-20 NOTE — Progress Notes (Addendum)
PROGRESS NOTE    Katrina Willis  KDX:[id number] DOB: [birth year] DOA: 11/11/2019 PCP: Patient, No Pcp Per    Brief Narrative:  Patient admitted to the hospital with the working diagnosis of acute hypoxic respiratory failure due to SARS COVID-19 viral pneumonia.  64 year old female who presented with dyspnea.  She does have significant past medical history for type 2 diabetes mellitus.  She resides in [place] and is visiting [place] since 5/4.  For the last 10 days she has been experiencing flulike symptoms.  Positive fatigue, weakness, and poor appetite.  For the last 24 hours prior to hospitalization she developed significant worsening dyspnea.  She tested positive for COVID-19 11/13/2019. On her initial physical examination blood pressure 123/70, heart rate 77, respiratory rate 32, oxygen saturation 90%, she had bibasilar rhonchi, no wheezing, positive tachypnea and increased work of breathing, heart S1-S2, present rhythmic, abdomen soft, no lower extremity edema. Sodium 133, potassium 3.7, chloride 96, bicarb 22, glucose 392, BUN 16, creatinine 0.77, white count 12.5, hemoglobin 14.5, hematocrit 40.6, platelets 203.  SARS COVID-19 positive.  Her chest radiograph had diffuse bilateral interstitial infiltrates, upper lobes lower lobes, predominantly at bases and peripherally.  CT chest with diffuse bilateral groundglass opacities, more dense opacities at bases bilaterally.  Negative for pulmonary embolism.  EKG 76 bpm, left axis deviation normal intervals, sinus rhythm, no ST segment or T wave changes.  Patient with high oxygen requirements, per non re-breather and heated high flow nasal cannula, she has received Tocilizumab, convalescent plasma, remdesivir and systemic corticosteroids.  Her family has requested hydroxychloroquine.  Patient has continued on very high oxygen requirements and increased work of breathing.    Assessment & Plan:   Principal Problem:   Acute hypoxemic respiratory  failure due to COVID-19 [facility]) Active Problems:   Pneumonia due to COVID-19 virus   DVT (deep venous thrombosis) (HCC)   Type 2 diabetes mellitus with other specified complication (HCC)   Class 2 obesity   1.  Acute hypoxic respiratory failure due to SARS COVID-19 viral pneumonia. Sp Tocilizumab 05/14, covid 19 convalescent plasma 05/15, remdesivir #5.   RR: 26 to 28  Pulse oxymetry: 85 to 86%  Fi02:40 L/ min HFNC  COVID-19 Labs  Recent Labs    11/18/19 0236 11/19/19 0215 11/20/19 0313  DDIMER >20.00* >20.00* 19.75*  FERRITIN  --   --  410*  CRP 0.8 0.7 0.6    Lab Results  Component Value Date   SARSCOV2NAA POSITIVE (A) 11/22/2019    Patient with persistent high oxygen requirements up to 40 L, CRP continue trending down along with her D dimer. Patient has been out of bed to chair yesterday and this am. Tolerating po well, continue to have fatigue and dyspnea.  Urine output over last 24 H is 800 ml.   Continue medical therapy with systemic steroids, 36.4 mg IV q 12 H, airway clearing techniques with incentive spirometer and flutter valve, bronchodilator therapy and antitussive agents.   On vitamin C and folic acid. Will hold on diuresis today.   2. Acute left DVT. Positive dvt left gastrocnemius, tolerating full anticoagulation with enoxaparin.    2. Uncontrolled T2DM (Hgb A1c 8,1) with steroid induced hyperglycemia. Fasting glucose 164, continue to encourage oral intake.   On insulin sliding scale for glucose cover and monitoring, continue basal insulin 14 units bid, 3 units premeal and trajenda.   3. Obesity class 2. Calculated BMI 31,0, follow up as outpatient.   4. Hypokalemia. K is down to 3,2  this am, will continue correction with Kcl, will follow renal panel in am.   Holding IV fluids.   Remains inpatient appropriate because:IV treatments appropriate due to intensity of illness or inability to take PO   Dispo: The patient is from: Home               Anticipated d/c is to: Home              Anticipated d/c date is: > 3 days              Patient currently is not medically stable to d/c.    DVT prophylaxis: Enoxaparin   Code Status:   full  Family Communication:  I called Mr. Madlyn Frankel [phone], patient's brother, no answer, left a message.   Consultants:   Pulmonary     Subjective: Patient continue to have dyspnea and cough, positive fatigue, no nausea or vomiting, poor oral intake. Out of bed to chair this am.   Objective: Vitals:   11/20/19 0400 11/20/19 0415 11/20/19 0800 11/20/19 0802  BP:    (!) 127/47  Pulse: (!) 56  65 64  Resp:   (!) 26 (!) 28  Temp: 98.1 F (36.7 C)     TempSrc: Axillary     SpO2: 93% 90% (!) 85% (!) 86%  Weight:      Height:        Intake/Output Summary (Last 24 hours) at 11/20/2019 0830 Last data filed at 11/20/2019 0800 Gross per 24 hour  Intake 100.03 ml  Output 800 ml  Net -699.97 ml   Filed Weights   December 03, 2019 1402 Dec 03, 2019 2106  Weight: 72.6 kg 82.2 kg    Examination:   General: deconditioned and ill looking appearing, no increased work of breathing.  Neurology: Awake and alert, non focal  E ENT: mild pallor, no icterus, oral mucosa moist Cardiovascular: No JVD. S1-S2 present, rhythmic, no gallops, rubs, or murmurs. No lower extremity edema. Pulmonary: positive breath sounds bilaterally, decreased air movement, no wheezing, or rhonchi no significant rales. Gastrointestinal. Abdomen with no organomegaly, non tender, no rebound or guarding Skin. No rashes Musculoskeletal: no joint deformities     Data Reviewed: I have personally reviewed following labs and imaging studies  CBC: Recent Labs  Lab 11/15/19 0238 11/16/19 0309 11/17/19 0224 11/18/19 0236 11/19/19 0215  WBC 9.7 18.9* 16.2* 13.9* 16.7*  NEUTROABS 8.3* 16.1* 13.3* 11.7* 14.5*  HGB 13.6 13.3 13.2 13.8 14.3  HCT 38.5 37.5 37.3 39.1 41.5  MCV 85.9 86.0 85.9 88.3 87.7  PLT 206 213 197 198 133*    Basic Metabolic Panel: Recent Labs  Lab 11/16/19 0309 11/17/19 0224 11/18/19 0236 11/19/19 0215 11/20/19 0313  NA 139 142 141 143 139  K 3.5 3.7 4.7 3.9 3.2*  CL 103 107 105 106 102  CO2 26 25 25 23 26   GLUCOSE 323* 127* 322* 185* 164*  BUN 28* 27* 24* 18 19  CREATININE 0.66 0.65 0.84 0.55 0.49  CALCIUM 8.3* 8.4* 8.3* 8.3* 8.1*   GFR: Estimated Creatinine Clearance: 74.7 mL/min (by C-G formula based on SCr of 0.49 mg/dL). Liver Function Tests: Recent Labs  Lab 11/16/19 0309 11/17/19 0224 11/18/19 0236 11/19/19 0215 11/20/19 0313  AST 65* 54* 38 32 28  ALT 44 43 33 26 20  ALKPHOS 84 101 111 144* 163*  BILITOT 0.9 0.8 1.3* 0.9 1.1  PROT 6.8 6.4* 6.1* 5.9* 6.0*  ALBUMIN 2.9* 2.7* 2.7* 2.9* 2.9*   No results for  input(s): LIPASE, AMYLASE in the last 168 hours. No results for input(s): AMMONIA in the last 168 hours. Coagulation Profile: No results for input(s): INR, PROTIME in the last 168 hours. Cardiac Enzymes: No results for input(s): CKTOTAL, CKMB, CKMBINDEX, TROPONINI in the last 168 hours. BNP (last 3 results) No results for input(s): PROBNP in the last 8760 hours. HbA1C: No results for input(s): HGBA1C in the last 72 hours. CBG: Recent Labs  Lab 11/18/19 2112 11/19/19 0743 11/19/19 1158 11/19/19 1720 11/19/19 2125  GLUCAP 166* 146* 179* 296* 262*   Lipid Profile: No results for input(s): CHOL, HDL, LDLCALC, TRIG, CHOLHDL, LDLDIRECT in the last 72 hours. Thyroid Function Tests: No results for input(s): TSH, T4TOTAL, FREET4, T3FREE, THYROIDAB in the last 72 hours. Anemia Panel: Recent Labs    11/20/19 0313  FERRITIN 410*      Radiology Studies: I have reviewed all of the imaging during this hospital visit personally     Scheduled Meds: . vitamin C  500 mg Oral Daily  . Chlorhexidine Gluconate Cloth  6 each Topical Daily  . chlorpheniramine-HYDROcodone  5 mL Oral Q12H  . enoxaparin (LOVENOX) injection  1 mg/kg Subcutaneous Q12H  .  hydroxychloroquine  200 mg Oral BID  . insulin aspart  0-15 Units Subcutaneous TID WC  . insulin aspart  0-5 Units Subcutaneous QHS  . insulin aspart  3 Units Subcutaneous TID WC  . insulin detemir  14 Units Subcutaneous BID  . Ipratropium-Albuterol  1 puff Inhalation QID  . linagliptin  5 mg Oral Daily  . mouth rinse  15 mL Mouth Rinse BID  . methylPREDNISolone (SOLU-MEDROL) injection  0.5 mg/kg Intravenous Q12H  . potassium chloride  20 mEq Oral Q4H  . zinc sulfate  220 mg Oral Daily   Continuous Infusions: . potassium chloride Stopped (11/20/19 0748)     LOS: 6 days        Damia Bobrowski Annett Gula, MD

## 2019-11-20 NOTE — Progress Notes (Signed)
K replacement started per protocol. PT unable to tolerate infusion and normal rate. I have turned medication down to 37ml/hr. Will re-assess if it is able to be tolerated at this rate and request alternative if not.

## 2019-11-20 NOTE — Progress Notes (Addendum)
NAME:  Katrina Willis, MRN:  [id number], DOB:  1955-08-20, LOS: 6 ADMISSION DATE:  12/10/19, CONSULTATION DATE:  5/18 REFERRING MD:  Elvera Lennox, CHIEF COMPLAINT:  dyspnea   Brief History   64 year old female admitted on May 14 in the setting of Covid pneumonia causing acute hypoxemic respiratory failure.  Past Medical History  DM2   Significant Hospital Events   5/14 admission for COVID pneumonia  Consults:  PCCM  Procedures:    Significant Diagnostic Tests:  Nov 15, 2019 CT angiogram chest: No pulmonary embolism, bilateral groundglass opacification and interlobular septal thickening likely related to viral pneumonia May 16 lower extremity Doppler ultrasound acute DVT left gastrocnemius vein  Micro Data:  5/14 Blood >  5/14 SARS CoV 2 > positive  Antimicrobials/COVID Rx  May 14 remdesivir> May 18 May 14 Tocilizumab May 15 Convalescent plasma  May 15 Plaquenil  Interim history/subjective:   Feels better, doesn't hurt as much to cough  Objective   Blood pressure (!) 127/47, pulse 80, temperature 98.1 F (36.7 C), temperature source Axillary, resp. rate (!) 24, height 5\' 4"  (1.626 m), weight 82.2 kg, SpO2 (!) 85 %.    FiO2 (%):  [100 %] 100 %   Intake/Output Summary (Last 24 hours) at 11/20/2019 1211 Last data filed at 11/20/2019 1021 Gross per 24 hour  Intake 302.75 ml  Output 800 ml  Net -497.25 ml   Filed Weights   10-Dec-2019 1402 10-Dec-2019 2106  Weight: 72.6 kg 82.2 kg    Examination:  General:  Resting comfortably in bed HENT: NCAT OP clear PULM: Crackles, wheezing in bases B, normal effort CV: RRR, no mgr GI: BS+, soft, nontender MSK: normal bulk and tone Neuro: awake, alert, no distress, MAEW   Resolved Hospital Problem list     Assessment & Plan:  ARDS due to COVID-19 pneumonia: Currently breathing comfortably but profoundly hypoxemic. Tolerate periods of hypoxemia, goal at rest is greater than 85% SaO2, with movement ideally above  75% Decision for intubation should be based on a change in mental status or physical evidence of ventilatory failure such as nasal flaring, accessory muscle use, paradoxical breathing Out of bed to chair as able Incentive spirometry is important, use every hour Prone positioning while in bed May 20: After lengthy conversation she tells me that she does not want to go on mechanical ventilation.  I advised that frequently patients who transition from heated high flow oxygen to mechanical ventilation after 1 week of treatment in hospital did not do very well.  She is not interested in going on mechanical ventilation.  Will adjust methylprednisolone dose down by 50% to minimize muscle weakness, continue close monitoring in the ICU.  Acute DVT:  Continue lovenox Could transition to Xarelto or Eliquis from PCCM standpoint  Best practice:  Per [facility]  Labs   CBC: Recent Labs  Lab 11/15/19 0238 11/16/19 0309 11/17/19 0224 11/18/19 0236 11/19/19 0215  WBC 9.7 18.9* 16.2* 13.9* 16.7*  NEUTROABS 8.3* 16.1* 13.3* 11.7* 14.5*  HGB 13.6 13.3 13.2 13.8 14.3  HCT 38.5 37.5 37.3 39.1 41.5  MCV 85.9 86.0 85.9 88.3 87.7  PLT 206 213 197 198 133*    Basic Metabolic Panel: Recent Labs  Lab 11/16/19 0309 11/17/19 0224 11/18/19 0236 11/19/19 0215 11/20/19 0313  NA 139 142 141 143 139  K 3.5 3.7 4.7 3.9 3.2*  CL 103 107 105 106 102  CO2 26 25 25 23 26   GLUCOSE 323* 127* 322* 185* 164*  BUN 28*  27* 24* 18 19  CREATININE 0.66 0.65 0.84 0.55 0.49  CALCIUM 8.3* 8.4* 8.3* 8.3* 8.1*   GFR: Estimated Creatinine Clearance: 74.7 mL/min (by C-G formula based on SCr of 0.49 mg/dL). Recent Labs  Lab 11/25/2019 1407 11/16/2019 1440 11/15/19 0238 11/16/19 0309 11/17/19 0224 11/18/19 0236 11/19/19 0215  PROCALCITON 0.24  --   --   --   --   --   --   WBC 12.5*  --    < > 18.9* 16.2* 13.9* 16.7*  LATICACIDVEN 2.4* 2.0*  --   --   --   --   --    < > = values in this interval not displayed.    Liver  Function Tests: Recent Labs  Lab 11/16/19 0309 11/17/19 0224 11/18/19 0236 11/19/19 0215 11/20/19 0313  AST 65* 54* 38 32 28  ALT 44 43 33 26 20  ALKPHOS 84 101 111 144* 163*  BILITOT 0.9 0.8 1.3* 0.9 1.1  PROT 6.8 6.4* 6.1* 5.9* 6.0*  ALBUMIN 2.9* 2.7* 2.7* 2.9* 2.9*   No results for input(s): LIPASE, AMYLASE in the last 168 hours. No results for input(s): AMMONIA in the last 168 hours.  ABG    Component Value Date/Time   HCO3 23.2 11/13/2019 1407   ACIDBASEDEF 1.8 11/16/2019 1407   O2SAT 54.2 11/21/2019 1407     Coagulation Profile: No results for input(s): INR, PROTIME in the last 168 hours.  Cardiac Enzymes: No results for input(s): CKTOTAL, CKMB, CKMBINDEX, TROPONINI in the last 168 hours.  HbA1C: Hgb A1c MFr Bld  Date/Time Value Ref Range Status  11/15/2019 02:38 AM 8.1 (H) 4.8 - 5.6 % Final    Comment:    (NOTE) Pre diabetes:          5.7%-6.4% Diabetes:              >6.4% Glycemic control for   <7.0% adults with diabetes   11/19/2019 06:21 PM 7.9 (H) 4.8 - 5.6 % Final    Comment:    (NOTE) Pre diabetes:          5.7%-6.4% Diabetes:              >6.4% Glycemic control for   <7.0% adults with diabetes     CBG: Recent Labs  Lab 11/19/19 0743 11/19/19 1158 11/19/19 1720 11/19/19 2125 11/20/19 0838  GLUCAP 146* 179* 296* 262* 180*     Critical care time: 30 minutes    Roselie Awkward, MD [facility] Pager: [phone] Cell: [phone] If no response, call [phone]

## 2019-11-20 NOTE — Progress Notes (Signed)
Pt is alert and oriented, she remains on HHF 100%fio2 and 40% with non rebreather on top. Pt laying on left side, educated on laying prone and getting up to chair today. Pt states she wants to stay on her side for now. O2 at 87%.

## 2019-11-20 NOTE — Progress Notes (Signed)
ANTICOAGULATION CONSULT NOTE  Pharmacy Consult for Lovenox Indication: DVT  Allergies  Allergen Reactions  . Janumet [Sitagliptin-Metformin Hcl] Diarrhea and Nausea And Vomiting    Intolerance    Patient Measurements: Height: 5\' 4"  (162.6 cm) Weight: 82.2 kg (181 lb 3.5 oz) IBW/kg (Calculated) : 54.7 Heparin Dosing Weight: 72.5,g  Vital Signs: Temp: 98.1 F (36.7 C) (05/20 0400) Temp Source: Axillary (05/20 0400) BP: 127/47 (05/20 0802) Pulse Rate: 80 (05/20 1150)  Labs: Recent Labs    11/18/19 0236 11/19/19 0215 11/20/19 0313  HGB 13.8 14.3  --   HCT 39.1 41.5  --   PLT 198 133*  --   CREATININE 0.84 0.55 0.49   Estimated Creatinine Clearance: 74.7 mL/min (by C-G formula based on SCr of 0.49 mg/dL).   Assessment: 11 yoF admit 5/14 with [facility], COVID+ on 5/13 as outpatient currently on Lovenox 1 mg/kg q 12 hours for left DVT.   - CBC stable - SCr stable - no bleeding reported  Goal of Therapy:  Monitor platelets by anticoagulation protocol: Yes   Plan:  Continue Lovenox 1 mg/kg q 12 hours F/u ability to transition to DOAC Will sign off note writing and follow peripherally  6/13, PharmD, BCPS 11/20/2019 11:58 AM

## 2019-11-20 NOTE — Progress Notes (Signed)
K+ 3.2 Replaced per protocol  

## 2019-11-21 ENCOUNTER — Inpatient Hospital Stay (HOSPITAL_COMMUNITY): Payer: HRSA Program

## 2019-11-21 DIAGNOSIS — E1169 Type 2 diabetes mellitus with other specified complication: Secondary | ICD-10-CM

## 2019-11-21 DIAGNOSIS — J1282 Pneumonia due to coronavirus disease 2019: Secondary | ICD-10-CM

## 2019-11-21 LAB — COMPREHENSIVE METABOLIC PANEL
ALT: 21 U/L (ref 0–44)
AST: 42 U/L — ABNORMAL HIGH (ref 15–41)
Albumin: 3.1 g/dL — ABNORMAL LOW (ref 3.5–5.0)
Alkaline Phosphatase: 225 U/L — ABNORMAL HIGH (ref 38–126)
Anion gap: 11 (ref 5–15)
BUN: 19 mg/dL (ref 8–23)
CO2: 26 mmol/L (ref 22–32)
Calcium: 8.1 mg/dL — ABNORMAL LOW (ref 8.9–10.3)
Chloride: 103 mmol/L (ref 98–111)
Creatinine, Ser: 0.65 mg/dL (ref 0.44–1.00)
GFR calc Af Amer: >60 mL/min (ref >60)
GFR calc non Af Amer: >60 mL/min (ref >60)
Glucose, Bld: 166 mg/dL — ABNORMAL HIGH (ref 70–99)
Potassium: 4.6 mmol/L (ref 3.5–5.1)
Sodium: 140 mmol/L (ref 135–145)
Total Bilirubin: 1.7 mg/dL — ABNORMAL HIGH (ref 0.3–1.2)
Total Protein: 6.1 g/dL — ABNORMAL LOW (ref 6.5–8.1)

## 2019-11-21 LAB — URINALYSIS, ROUTINE W REFLEX MICROSCOPIC
Bilirubin Urine: NEGATIVE
Glucose, UA: NEGATIVE mg/dL
Hgb urine dipstick: NEGATIVE
Ketones, ur: NEGATIVE mg/dL
Leukocytes,Ua: NEGATIVE
Nitrite: POSITIVE — AB
Protein, ur: NEGATIVE mg/dL
Specific Gravity, Urine: 1.014 (ref 1.005–1.030)
pH: 5 (ref 5.0–8.0)

## 2019-11-21 LAB — GLUCOSE, CAPILLARY
Glucose-Capillary: 229 mg/dL — ABNORMAL HIGH (ref 70–99)
Glucose-Capillary: 285 mg/dL — ABNORMAL HIGH (ref 70–99)
Glucose-Capillary: 219 mg/dL — ABNORMAL HIGH (ref 70–99)
Glucose-Capillary: 248 mg/dL — ABNORMAL HIGH (ref 70–99)

## 2019-11-21 LAB — FERRITIN: Ferritin: 553 ng/mL — ABNORMAL HIGH (ref 11–307)

## 2019-11-21 LAB — C-REACTIVE PROTEIN: CRP: 0.6 mg/dL (ref <1.0)

## 2019-11-21 LAB — D-DIMER, QUANTITATIVE: D-Dimer, Quant: >20 ug/mL-FEU — ABNORMAL HIGH (ref 0.00–0.50)

## 2019-11-21 MED ORDER — ADULT MULTIVITAMIN W/MINERALS CH
1.0000 | ORAL_TABLET | Freq: Every day | ORAL | Status: DC
  Administered 2019-11-21 – 2019-11-22 (×2): 1 via ORAL
  Filled 2019-11-21 (×2): qty 1

## 2019-11-21 MED ORDER — PRO-STAT SUGAR FREE PO LIQD
30.0000 mL | Freq: Every day | ORAL | Status: DC
  Administered 2019-11-21: 30 mL via ORAL
  Filled 2019-11-21: qty 30

## 2019-11-21 MED ORDER — FLUOXETINE HCL 10 MG PO CAPS
10.0000 mg | ORAL_CAPSULE | Freq: Every day | ORAL | Status: DC
  Administered 2019-11-21 – 2019-11-22 (×2): 10 mg via ORAL
  Filled 2019-11-21 (×3): qty 1

## 2019-11-21 MED ORDER — FUROSEMIDE 10 MG/ML IJ SOLN
40.0000 mg | Freq: Once | INTRAMUSCULAR | Status: AC
  Administered 2019-11-21: 40 mg via INTRAVENOUS
  Filled 2019-11-21: qty 4

## 2019-11-21 MED ORDER — INSULIN DETEMIR 100 UNIT/ML ~~LOC~~ SOLN
14.0000 [IU] | Freq: Every day | SUBCUTANEOUS | Status: DC
  Administered 2019-11-22: 14 [IU] via SUBCUTANEOUS
  Filled 2019-11-21 (×2): qty 0.14

## 2019-11-21 MED ORDER — ENSURE ENLIVE PO LIQD
237.0000 mL | Freq: Two times a day (BID) | ORAL | Status: DC
  Administered 2019-11-21: 237 mL via ORAL

## 2019-11-21 NOTE — Plan of Care (Signed)
During shift patient unable to stay at goal O2 sats at rest of >85%. Even at rest pt was in the low 70's but still able to carry on conversations with Mds and did not feel in distress at the time.  Pt stated she was unable to prone due to knee pain but would continue laying on her side to promote lung inflation.  Pt up to chair x 1 and did increase oral intake since diet changed to regular.  Pt given 40mg  of IV Lasix x 1 during shift and was able to put out >744ml. Problem: Education: Goal: Knowledge of General Education information will improve Description: Including pain rating scale, medication(s)/side effects and non-pharmacologic comfort measures Outcome: Not Progressing   Problem: Health Behavior/Discharge Planning: Goal: Ability to manage health-related needs will improve Outcome: Not Progressing   Problem: Clinical Measurements: Goal: Ability to maintain clinical measurements within normal limits will improve Outcome: Not Progressing Goal: Will remain free from infection Outcome: Not Progressing Goal: Diagnostic test results will improve Outcome: Not Progressing Goal: Respiratory complications will improve Outcome: Not Progressing Goal: Cardiovascular complication will be avoided Outcome: Not Progressing   Problem: Activity: Goal: Risk for activity intolerance will decrease Outcome: Not Progressing   Problem: Nutrition: Goal: Adequate nutrition will be maintained Outcome: Not Progressing   Problem: Coping: Goal: Level of anxiety will decrease Outcome: Not Progressing   Problem: Elimination: Goal: Will not experience complications related to bowel motility Outcome: Not Progressing Goal: Will not experience complications related to urinary retention Outcome: Not Progressing   Problem: Pain Managment: Goal: General experience of comfort will improve Outcome: Not Progressing   Problem: Safety: Goal: Ability to remain free from injury will improve Outcome: Not  Progressing   Problem: Skin Integrity: Goal: Risk for impaired skin integrity will decrease Outcome: Not Progressing   Problem: Education: Goal: Knowledge of risk factors and measures for prevention of condition will improve Outcome: Not Progressing   Problem: Coping: Goal: Psychosocial and spiritual needs will be supported Outcome: Not Progressing   Problem: Respiratory: Goal: Will maintain a patent airway Outcome: Not Progressing Goal: Complications related to the disease process, condition or treatment will be avoided or minimized Outcome: Not Progressing

## 2019-11-21 NOTE — Progress Notes (Signed)
NAME:  Katrina Willis, MRN:  [id number], DOB:  [birth year], LOS: 7 ADMISSION DATE:  11/16/2019, CONSULTATION DATE:  5/18 REFERRING MD:  Cruzita Lederer, CHIEF COMPLAINT:  dyspnea   Brief History   64 year old female admitted 5/14 in the setting of Covid pneumonia causing acute hypoxemic respiratory failure.  Past Medical History  DM2   Significant Hospital Events   5/14 admission for COVID pneumonia 5/20 Patient elected for DNR/DNI   Consults:  PCCM  Procedures:    Significant Diagnostic Tests:  CT angiogram chest 5/15 >> No pulmonary embolism, bilateral groundglass opacification and interlobular septal thickening likely related to viral pneumonia BLE Venous Duplex 5/16 >> acute DVT left gastrocnemius vein  Micro Data:  COVID 5/14 >> positive  MRSA PCR 5/15 >> negative  BCx2 5/14 >> negative  Antimicrobials/COVID Rx  Toclizumab 5/14  Convalescent Plasma 5/15  Remdesivir 5/14 >> 5/18  Plaquenil 5/15 >> 5/16  Interim history/subjective:  Pt reports she feels better after getting medicine.  States the nights are hard for her - bad dreams, does not sleep well.    O2 remains at 100%, 60L flow   Objective   Blood pressure 127/69, pulse 73, temperature (!) 97.1 F (36.2 C), temperature source Oral, resp. rate (!) 28, height 5\' 4"  (1.626 m), weight 82.2 kg, SpO2 (!) 84 %.    FiO2 (%):  [100 %] 100 %   Intake/Output Summary (Last 24 hours) at 11/21/2019 0943 Last data filed at 11/21/2019 0855 Gross per 24 hour  Intake 666.03 ml  Output 1350 ml  Net -683.97 ml   Filed Weights   11/09/2019 1402 11/29/2019 2106  Weight: 72.6 kg 82.2 kg    Examination: General: ill appearing adult female lying in bed in NAD  HEENT: MM pink/moist, [place] O2 + NRB in place, anicteric  Neuro: AAOx4, speech clear, MAE CV: s1s2 RRR, no m/r/g PULM: mild tachypnea, non-labored, lungs bilaterally with posterior crackles  GI: soft, bsx4 active  Extremities: warm/dry, no edema  Skin: no rashes or  lesions   Resolved Hospital Problem list     Assessment & Plan:   ARDS secondary to COVID-19 Pneumonia LLE DVT  -wean O2 for sats >85% at rest, movement >75%.  Anticipate she will be slow to recover with periods of desaturation. -DNR/DNI  -push prone positioning while in bed, mobilize OOB  -follow intermittent CXR  -agree with lasix per primary  -PRN tussionex for cough  Acute LLE DVT  -continue lovenox per pharmacy  -transition to xarelto or eliquis per primary      Patient requested no further information be given to her brother.    Best practice:  Per TRH  Labs   CBC: Recent Labs  Lab 11/15/19 0238 11/16/19 0309 11/17/19 0224 11/18/19 0236 11/19/19 0215  WBC 9.7 18.9* 16.2* 13.9* 16.7*  NEUTROABS 8.3* 16.1* 13.3* 11.7* 14.5*  HGB 13.6 13.3 13.2 13.8 14.3  HCT 38.5 37.5 37.3 39.1 41.5  MCV 85.9 86.0 85.9 88.3 87.7  PLT 206 213 197 198 133*    Basic Metabolic Panel: Recent Labs  Lab 11/17/19 0224 11/18/19 0236 11/19/19 0215 11/20/19 0313 11/21/19 0340  NA 142 141 143 139 140  K 3.7 4.7 3.9 3.2* 4.6  CL 107 105 106 102 103  CO2 25 25 23 26 26   GLUCOSE 127* 322* 185* 164* 166*  BUN 27* 24* 18 19 19   CREATININE 0.65 0.84 0.55 0.49 0.65  CALCIUM 8.4* 8.3* 8.3* 8.1* 8.1*   GFR: Estimated Creatinine Clearance:  74.7 mL/min (by C-G formula based on SCr of 0.65 mg/dL). Recent Labs  Lab 2019/11/20 1407 11/20/2019 1440 11/15/19 0238 11/16/19 0309 11/17/19 0224 11/18/19 0236 11/19/19 0215  PROCALCITON 0.24  --   --   --   --   --   --   WBC 12.5*  --    < > 18.9* 16.2* 13.9* 16.7*  LATICACIDVEN 2.4* 2.0*  --   --   --   --   --    < > = values in this interval not displayed.    Liver Function Tests: Recent Labs  Lab 11/17/19 0224 11/18/19 0236 11/19/19 0215 11/20/19 0313 11/21/19 0340  AST 54* 38 32 28 42*  ALT 43 33 26 20 21   ALKPHOS 101 111 144* 163* 225*  BILITOT 0.8 1.3* 0.9 1.1 1.7*  PROT 6.4* 6.1* 5.9* 6.0* 6.1*  ALBUMIN 2.7* 2.7*  2.9* 2.9* 3.1*   No results for input(s): LIPASE, AMYLASE in the last 168 hours. No results for input(s): AMMONIA in the last 168 hours.  ABG    Component Value Date/Time   HCO3 23.2 2019-11-20 1407   ACIDBASEDEF 1.8 Nov 20, 2019 1407   O2SAT 54.2 11-20-19 1407     Coagulation Profile: No results for input(s): INR, PROTIME in the last 168 hours.  Cardiac Enzymes: No results for input(s): CKTOTAL, CKMB, CKMBINDEX, TROPONINI in the last 168 hours.  HbA1C: Hgb A1c MFr Bld  Date/Time Value Ref Range Status  11/15/2019 02:38 AM 8.1 (H) 4.8 - 5.6 % Final    Comment:    (NOTE) Pre diabetes:          5.7%-6.4% Diabetes:              >6.4% Glycemic control for   <7.0% adults with diabetes   11-20-2019 06:21 PM 7.9 (H) 4.8 - 5.6 % Final    Comment:    (NOTE) Pre diabetes:          5.7%-6.4% Diabetes:              >6.4% Glycemic control for   <7.0% adults with diabetes     CBG: Recent Labs  Lab 11/20/19 0838 11/20/19 1222 11/20/19 1728 11/20/19 2110 11/21/19 0853  GLUCAP 180* 316* 184* 78 229*     Critical care time:    11/15/2019, MSN, NP-C [facility] 11/21/2019, 9:51 AM   Please see [email or web address] for pager details.

## 2019-11-21 NOTE — Progress Notes (Signed)
PT Cancellation Note  Patient Details Name: Katrina Willis MRN: [id number] DOB: 07-Jan-1956   Cancelled Treatment:    Reason Eval/Treat Not Completed: Other (comment). Pt OOB and now just back to bed with nursing staff. Pt olerated OOB much of day. Provided nursing with T-band for pt at their request. Will attempt again as schedule permits   [facility] 11/21/2019, 2:48 PM

## 2019-11-21 NOTE — Progress Notes (Signed)
Initial Nutrition Assessment  DOCUMENTATION CODES:   Not applicable  INTERVENTION:  - will order Ensure Enlive BID, each supplement provides 350 kcal and 20 grams of protein. - will order 30 mL Prostat once/day, each supplement provides 100 kcal and 15 grams of protein. - will order 1 tablet multivitamin with minerals.  * weigh patient today.   NUTRITION DIAGNOSIS:   Increased nutrient needs related to acute illness, catabolic SWNIOEV(OJJKK-93 infection) as evidenced by estimated needs.  GOAL:   Patient will meet greater than or equal to 90% of their needs  MONITOR:   PO intake, Supplement acceptance, Labs, Weight trends  REASON FOR ASSESSMENT:   Consult Assessment of nutrition requirement/status  ASSESSMENT:   64 year old female with medical history of type 2 DM who presented to the ED with SOB/dyspnea. Patient lives in [place] but has been visiting in the [place]. since 5/4. She was experiencing flu-like symptoms for 10 days PTA: fatigue, weakness, and poor appetite. She tested positive for COVID-19 on 11/13/19. CT chest showed bilateral ground glass opacities. She has been requiring heated HFNC and NRB. She has received convalescent plasma, remdesivir, and corticosteroids. Code status is DNR/DNI.  Per flow sheet documentation, she consumed 0% of lunch on 5/18; 10% of breakfast on 5/20 (51 kcal, 3 grams protein); 50% of breakfast this AM (160 kcal, 7 grams protein). Did not enter patient's room but able to visualize patient and breakfast tray through the window.   Able to talk briefly with RN. Diet has been liberalized to Regular to encourage better intakes. Patient had requested RN order lunch but RN waiting a little d/t patient being too sleepy to safely eat. Patient was discussed in rounds this AM. She de-sats very easily and with all movement. Patient is currently on heated HFNC (60L) and NRB.  Per chart review, weight on 5/14 was 181 lb and patient has not been weighed since  that time. No PTA wt information is available.   Per notes: - [id number] PNA--worsening bilateral pulmonary infiltrates - acute L DVT - hx of type 2 DM with HgbA1c of 8.1% - likely plan for SNF at the time of d/c   Labs reviewed; CBGs: 229 and 285 mg/dl, Ca: 8.1 mg/dl, Alk Phos elevated and trending up, AST elevated. Medications reviewed; 500 mg ascorbic acid/day, 40 mg IV lasix x1 dose 5/21, sliding scale novolog, 3 units novolog TID, 14 units levemir/day, 5 mg tradjenta/day, 40 mg IV solu-medrol/day, 220 mg zinc sulfate/day.     NUTRITION - FOCUSED PHYSICAL EXAM:  unable to complete at this time.   Diet Order:   Diet Order            Diet regular Room service appropriate? Yes; Fluid consistency: Thin  Diet effective now              EDUCATION NEEDS:   No education needs have been identified at this time  Skin:  Skin Assessment: Reviewed RN Assessment  Last BM:  5/18  Height:   Ht Readings from Last 1 Encounters:  11/20/2019 5' 4" (1.626 m)    Weight:   Wt Readings from Last 1 Encounters:  11/10/2019 82.2 kg    Estimated Nutritional Needs:  Kcal:  2200-2450 kcal Protein:  110-120 grams Fluid:  >/= 1.8 L/day     Jarome Matin, MS, RD, LDN, CNSC Inpatient Clinical Dietitian RD pager # available in [place]  After hours/weekend pager # available in [facility]

## 2019-11-21 NOTE — Progress Notes (Signed)
Chaplain providing support at bedside in response to spiritual care consult / pt request.    Provided empathic pastoral presence, emotional and spiritual support around illness, family system stress.     Mrs. Tobia states "I know I'm getting better, but I don't feel it yet."    Speaks with chaplain about feeling stuck while holding on to hope.  She notes most stressful moments are waking in night and feeling unable to breathe.    Notes that she will not be able to return to [place] with her spouse, who has fallen recently and is not able to care for her.  She is hopeful to stay with a family member and for spouse to be able to join her in the states.    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  Faith and prayer is a resource for her coping.   Other elements of stress: her son has been "picked up by police" and taken back to [place] after serving a 100-day rehab in [place].  She expresses  "I am grateful he is not wandering the streets of [facility]," but she is unable to speak to his parole officer over phone due to de-sating in conversation.  She has been able to communicate this to parole officer, who understands.   Mrs. Rossetti reported another child who died due to substance use - and speaks with chaplain about holding both longing for her son, but also recognition and grief.    Would benefit from follow up.  This chaplain will follow in person on Monday.  Mrs. Holtrop understands to request nursing to page chaplain oncall if spiritual care needs arise - please page 24 hour oncall number [phone]

## 2019-11-21 NOTE — Progress Notes (Addendum)
Pt O2 sat dropping to 46-55%. Pt still asymptomatic. Consulted w/ elink RN. Informed heated HF should be at 60L at most. Heated HF turned down from 70L to 55L Repositioned pt to L side. O2 sat now 78-80%

## 2019-11-21 NOTE — TOC Progression Note (Signed)
Transition of Care [facility]) - Progression Note    Patient Details  Name: Katrina Willis MRN: [id number] Date of Birth: 1955-10-22  Transition of Care [facility]) CM/SW Contact  Golda Acre, RN Phone Number: 11/21/2019, 8:07 AM  Clinical Narrative:    hfnc via nrm at 60l/min-o2,iv solumedrol and nebs ongoing, improved in cough.  D.simer >20.00 covid +  Expected Discharge Plan: Home/Self Care Barriers to Discharge: Continued Medical Work up  Expected Discharge Plan and Services Expected Discharge Plan: Home/Self Care   Discharge Planning Services: CM Consult   Living arrangements for the past 2 months: Single Family Home                                       Social Determinants of Health (SDOH) Interventions    Readmission Risk Interventions No flowsheet data found.

## 2019-11-21 NOTE — Progress Notes (Addendum)
PROGRESS NOTE    Katrina Willis  RKY:[id number] DOB: [birth year] DOA: 11/21/19 PCP: Patient, No Pcp Per    Brief Narrative:  Patient admitted to the hospital with the working diagnosis of acute hypoxic respiratory failure due to SARS COVID-19 viral pneumonia.  64 year old female who presented with dyspnea. She does have significant past medical history for type 2 diabetes mellitus. She resides in [place] and is visiting [place] since 5/4. For the last 10 days she has been experiencing flulike symptoms. Positive fatigue, weakness, andpoor appetite.For the last 24 hours prior to hospitalization she developed significant worsening dyspnea.She tested positive for COVID-19 11/13/2019. On her initial physical examination blood pressure 123/70, heart rate 77, respiratory rate 32, oxygen saturation 90%, she had bibasilar rhonchi, no wheezing, positive tachypnea and increased work of breathing, heart S1-S2, present rhythmic, abdomen soft, no lower extremity edema. Sodium 133, potassium 3.7, chloride 96, bicarb 22, glucose 392, BUN 16, creatinine 0.77, white count 12.5, hemoglobin 14.5, hematocrit 40.6, platelets 203. SARS COVID-19 positive. Her chest radiograph had diffuse bilateral interstitial infiltrates, upper lobes lower lobes, predominantly at bases and peripherally. CT chest with diffuse bilateral groundglass opacities, more dense opacities at bases bilaterally. Negative for pulmonary embolism. EKG 76 bpm, left axis deviation normal intervals, sinus rhythm, no ST segment or T wave changes.  Patient with high oxygen requirements,per non re-breather and heated high flow nasal cannula, she has received Tocilizumab, convalescent plasma, remdesivir and systemic corticosteroids. Her family has requested hydroxychloroquine.  Patienthas continued on very high oxygen requirements and increased work of breathing.  CODE status has been changed to DNR.   Patient with worsening bilateral  pulmonary infiltrates with increase 02 requirements now up to 60 L/ min.    Assessment & Plan:   Principal Problem:   Acute hypoxemic respiratory failure due to COVID-19 [facility]) Active Problems:   Pneumonia due to COVID-19 virus   DVT (deep venous thrombosis) (HCC)   Type 2 diabetes mellitus with other specified complication (HCC)   Class 2 obesity   1.Acute hypoxic respiratory failure due to SARS COVID-19 viral pneumonia.Sp Tocilizumab 05/14, covid 19 convalescent plasma 05/15, remdesivir #5.  RR: 27 to 28  Pulse oxymetry: 82 to 87%  Fi02: 60 L/ min per HFNC plus NRBM  COVID-19 Labs  Recent Labs    11/19/19 0215 11/20/19 0313 11/21/19 0340  DDIMER >20.00* 19.75* >20.00*  FERRITIN  --  410* 553*  CRP 0.7 0.6 0.6    Lab Results  Component Value Date   SARSCOV2NAA POSITIVE (A) 11-21-2019     Persistent elevation of inflammatory markers.  Follow up chest film with worsening pulmonary infiltrates bilaterally all 4 quadrants, with increased oxygen requirements up to 60 L/min per HFNC and NRBM, 100% Fi02.    Will add 40 mg IV furosemide for non cardiogenic pulmonary edema, will target a negative fluid balance.   Continue systemic steroids with methylprednisolone 40 mg IV q 24 H. Continue with airway clearing techniques with incentive spirometer and flutter valve, bronchodilator therapy and antitussive agents. Out of bed to chair and prone position as tolerated.   Continue vitamin.   2. Acute left DVT. Positive dvt left gastrocnemius. Continue full anticoagulation with enoxaparin.   2. Uncontrolled T2DM (Hgb A1c 8,1) with steroid induced hyperglycemia. Fasting glucose 166, patient with very poor oral intake, will liberate her diet and will consult nutrition for recommendations.  One episode of 78 mg/dl capillary glucose.   Continue with  insulin sliding scale for glucose cover and monitoring, plus  basal insulin 14 units, change to daily to prevent hypoglycemia, 3  units premeal and trajenda.   3. Obesity class 2. Calculated BMI 31,0,  Mobility with PT and OT.   4. Hypokalemia. Serum K up to 4,6 with renal function 0,65 cr and 26 BUN. Will continue close follow up of renal function and electrolytes, trial of diuresis today.   Status is: Inpatient  Remains inpatient appropriate because:IV treatments appropriate due to intensity of illness or inability to take PO   Dispo: The patient is from: Home              Anticipated d/c is to: SNF              Anticipated d/c date is: > 3 days              Patient currently is not medically stable to d/c.   DVT prophylaxis: Enoxaparin   Code Status:   DNR   Family Communication:  I spoke over the phone with the patient's girlfriend about patient's  condition, plan of care, prognosis and all questions were addressed.   Patient critically ill, acute hypoxic respiratory failure with imminent risk of deterioration.   Critical care time 60 minutes.   Consultants:   Pulmonary    Subjective: Patient continue to have significant dyspnea at rest and with minimal efforts. Very weak and deconditioned, very poor oral intake.   Objective: Vitals:   11/20/19 2000 11/21/19 0000 11/21/19 0400 11/21/19 0744  BP: (!) 116/45 100/71 108/63   Pulse: 72 75 72 74  Resp: 19 (!) 26 (!) 28 (!) 27  Temp: 98.6 F (37 C) 99.2 F (37.3 C) 98.4 F (36.9 C)   TempSrc: Axillary Axillary Axillary   SpO2: (!) 82% (!) 85% (!) 87% (!) 82%  Weight:      Height:        Intake/Output Summary (Last 24 hours) at 11/21/2019 0808 Last data filed at 11/20/2019 2130 Gross per 24 hour  Intake 582.72 ml  Output 950 ml  Net -367.28 ml   Filed Weights   11/30/2019 1402 11/15/2019 2106  Weight: 72.6 kg 82.2 kg    Examination:   General: deconditioned and ill looking appearing, positive dyspnea at rest.  Neurology: Awake and alert, non focal  E ENT: no pallor, no icterus, oral mucosa moist Cardiovascular: No JVD. S1-S2 present,  rhythmic, no gallops, rubs, or murmurs. Trace lower extremity edema. Pulmonary: decreased breath sounds bilaterally,  no wheezing,or rhonchi, positive bilateral bibasilar rales. Gastrointestinal. Abdomen with no organomegaly, non tender, no rebound or guarding Skin. No rashes Musculoskeletal: no joint deformities     Data Reviewed: I have personally reviewed following labs and imaging studies  CBC: Recent Labs  Lab 11/15/19 0238 11/16/19 0309 11/17/19 0224 11/18/19 0236 11/19/19 0215  WBC 9.7 18.9* 16.2* 13.9* 16.7*  NEUTROABS 8.3* 16.1* 13.3* 11.7* 14.5*  HGB 13.6 13.3 13.2 13.8 14.3  HCT 38.5 37.5 37.3 39.1 41.5  MCV 85.9 86.0 85.9 88.3 87.7  PLT 206 213 197 198 133*   Basic Metabolic Panel: Recent Labs  Lab 11/17/19 0224 11/18/19 0236 11/19/19 0215 11/20/19 0313 11/21/19 0340  NA 142 141 143 139 140  K 3.7 4.7 3.9 3.2* 4.6  CL 107 105 106 102 103  CO2 25 25 23 26 26   GLUCOSE 127* 322* 185* 164* 166*  BUN 27* 24* 18 19 19   CREATININE 0.65 0.84 0.55 0.49 0.65  CALCIUM 8.4* 8.3* 8.3* 8.1* 8.1*   GFR:  Estimated Creatinine Clearance: 74.7 mL/min (by C-G formula based on SCr of 0.65 mg/dL). Liver Function Tests: Recent Labs  Lab 11/17/19 0224 11/18/19 0236 11/19/19 0215 11/20/19 0313 11/21/19 0340  AST 54* 38 32 28 42*  ALT 43 33 26 20 21   ALKPHOS 101 111 144* 163* 225*  BILITOT 0.8 1.3* 0.9 1.1 1.7*  PROT 6.4* 6.1* 5.9* 6.0* 6.1*  ALBUMIN 2.7* 2.7* 2.9* 2.9* 3.1*   No results for input(s): LIPASE, AMYLASE in the last 168 hours. No results for input(s): AMMONIA in the last 168 hours. Coagulation Profile: No results for input(s): INR, PROTIME in the last 168 hours. Cardiac Enzymes: No results for input(s): CKTOTAL, CKMB, CKMBINDEX, TROPONINI in the last 168 hours. BNP (last 3 results) No results for input(s): PROBNP in the last 8760 hours. HbA1C: No results for input(s): HGBA1C in the last 72 hours. CBG: Recent Labs  Lab 11/19/19 2125  11/20/19 0838 11/20/19 1222 11/20/19 1728 11/20/19 2110  GLUCAP 262* 180* 316* 184* 78   Lipid Profile: No results for input(s): CHOL, HDL, LDLCALC, TRIG, CHOLHDL, LDLDIRECT in the last 72 hours. Thyroid Function Tests: No results for input(s): TSH, T4TOTAL, FREET4, T3FREE, THYROIDAB in the last 72 hours. Anemia Panel: Recent Labs    11/20/19 0313 11/21/19 0340  FERRITIN 410* 553*      Radiology Studies: I have reviewed all of the imaging during this hospital visit personally     Scheduled Meds: . vitamin C  500 mg Oral Daily  . Chlorhexidine Gluconate Cloth  6 each Topical Daily  . chlorpheniramine-HYDROcodone  5 mL Oral Q12H  . enoxaparin (LOVENOX) injection  1 mg/kg Subcutaneous Q12H  . insulin aspart  0-15 Units Subcutaneous TID WC  . insulin aspart  0-5 Units Subcutaneous QHS  . insulin aspart  3 Units Subcutaneous TID WC  . insulin detemir  14 Units Subcutaneous BID  . Ipratropium-Albuterol  1 puff Inhalation QID  . linagliptin  5 mg Oral Daily  . mouth rinse  15 mL Mouth Rinse BID  . methylPREDNISolone (SOLU-MEDROL) injection  40 mg Intravenous Daily  . zinc sulfate  220 mg Oral Daily   Continuous Infusions:   LOS: 7 days        Delmore Sear Gerome Apley, MD

## 2019-11-21 NOTE — Progress Notes (Signed)
Pt currently having O2 sat between 65-72%. Pt found on 70L w/ 100% heated HF and NRB @ 100%. Lungs are clear bilaterally. PRN guaifenesin and scheduled inhaler given. No c/o pain or respiratory distress. Pt easily arousable and able to answer questions. Pt made DNR yesterday and doesn't wish to be intubated.  RN will continue to monitor

## 2019-11-22 LAB — COMPREHENSIVE METABOLIC PANEL
ALT: 17 U/L (ref 0–44)
AST: 39 U/L (ref 15–41)
Albumin: 3.1 g/dL — ABNORMAL LOW (ref 3.5–5.0)
Alkaline Phosphatase: 260 U/L — ABNORMAL HIGH (ref 38–126)
Anion gap: 11 (ref 5–15)
BUN: 22 mg/dL (ref 8–23)
CO2: 28 mmol/L (ref 22–32)
Calcium: 8.2 mg/dL — ABNORMAL LOW (ref 8.9–10.3)
Chloride: 99 mmol/L (ref 98–111)
Creatinine, Ser: 0.58 mg/dL (ref 0.44–1.00)
GFR calc Af Amer: >60 mL/min (ref >60)
GFR calc non Af Amer: >60 mL/min (ref >60)
Glucose, Bld: 167 mg/dL — ABNORMAL HIGH (ref 70–99)
Potassium: 3.9 mmol/L (ref 3.5–5.1)
Sodium: 138 mmol/L (ref 135–145)
Total Bilirubin: 1.8 mg/dL — ABNORMAL HIGH (ref 0.3–1.2)
Total Protein: 6.1 g/dL — ABNORMAL LOW (ref 6.5–8.1)

## 2019-11-22 LAB — GLUCOSE, CAPILLARY
Glucose-Capillary: 276 mg/dL — ABNORMAL HIGH (ref 70–99)
Glucose-Capillary: 282 mg/dL — ABNORMAL HIGH (ref 70–99)
Glucose-Capillary: 348 mg/dL — ABNORMAL HIGH (ref 70–99)
Glucose-Capillary: 406 mg/dL — ABNORMAL HIGH (ref 70–99)
Glucose-Capillary: 209 mg/dL — ABNORMAL HIGH (ref 70–99)

## 2019-11-22 LAB — D-DIMER, QUANTITATIVE: D-Dimer, Quant: >20 ug/mL-FEU — ABNORMAL HIGH (ref 0.00–0.50)

## 2019-11-22 MED ORDER — FUROSEMIDE 10 MG/ML IJ SOLN
40.0000 mg | Freq: Once | INTRAMUSCULAR | Status: AC
  Administered 2019-11-22: 40 mg via INTRAVENOUS
  Filled 2019-11-22: qty 4

## 2019-11-22 MED ORDER — MOMETASONE FURO-FORMOTEROL FUM 200-5 MCG/ACT IN AERO
2.0000 | INHALATION_SPRAY | Freq: Two times a day (BID) | RESPIRATORY_TRACT | Status: DC
  Administered 2019-11-22 (×2): 2 via RESPIRATORY_TRACT
  Filled 2019-11-22: qty 8.8

## 2019-11-22 NOTE — Progress Notes (Signed)
PT Cancellation Note  Patient Details Name: Katrina Willis MRN: [id number] DOB: 28-Dec-1955   Cancelled Treatment:    Reason Eval/Treat Not Completed: Patient not medically ready. Pt  Is on 60L heated HFNC,+ NRB with SpO2=70s, HR 100s, RR 30s--> at rest. Will defer PT again today, continue to agree with OOB with nursing staff as tolerated, Has theraband per MD/nursing request--however given current status likely would not tolerate resistance exercise.  Will continue efforts to evaluate pt should her medical status improve.    [facility] 11/22/2019, 9:32 AM

## 2019-11-22 NOTE — Progress Notes (Signed)
   Per Dr Ella Jubilee - CCM can sign off    SIGNATURE    Dr. Kalman Shan, M.D., F.C.C.P,  Pulmonary and Critical Care Medicine Staff Physician, [facility] Director - Interstitial Lung Disease  Program  Pulmonary Fibrosis [facility] at [facility] [place], [place], [address]  Pager: [phone], If no answer or between  15:00h - 7:00h: call [phone] Telephone: [phone]  9:23 AM 11/22/2019

## 2019-11-22 NOTE — Progress Notes (Signed)
eLink Physician-Brief Progress Note Patient Name: Katrina Willis DOB: 10-Jul-1955 MRN: [id number]   Date of Service  11/22/2019  HPI/Events of Note  Hypoxia - d/t COVID pneumonia. Patient is on HFNC + NRBM. Patient is DNR/DNI. Sat = 73% and RR = 33.  eICU Interventions  Will order: 1. Lasix 40 mg IV X 1 now.      Intervention Category Major Interventions: Hypoxemia - evaluation and management  Quintin Hjort Dennard Nip 11/22/2019, 4:36 AM

## 2019-11-22 NOTE — Progress Notes (Addendum)
PROGRESS NOTE    VERNETTE MOISE  ALP:[id number] DOB: [birth year] DOA: 11/05/2019 PCP: Patient, No Pcp Per    Brief Narrative:  Patient admitted to the hospital with the working diagnosis of acute hypoxic respiratory failure due to SARS COVID-19 viral pneumonia.  64 year old female who presented with dyspnea. She does have significant past medical history for type 2 diabetes mellitus. She resides in [place] and is visiting [place] since 5/4. For the last 10 days she has been experiencing flulike symptoms. Positive fatigue, weakness, andpoor appetite.For the last 24 hours prior to hospitalization she developed significant worsening dyspnea.She tested positive for COVID-19 11/13/2019. On her initial physical examination blood pressure 123/70, heart rate 77, respiratory rate 32, oxygen saturation 90%, she had bibasilar rhonchi, no wheezing, positive tachypnea and increased work of breathing, heart S1-S2, present rhythmic, abdomen soft, no lower extremity edema. Sodium 133, potassium 3.7, chloride 96, bicarb 22, glucose 392, BUN 16, creatinine 0.77, white count 12.5, hemoglobin 14.5, hematocrit 40.6, platelets 203. SARS COVID-19 positive. Her chest radiograph had diffuse bilateral interstitial infiltrates, upper lobes lower lobes, predominantly at bases and peripherally. CT chest with diffuse bilateral groundglass opacities, more dense opacities at bases bilaterally. Negative for pulmonary embolism. EKG 76 bpm, left axis deviation normal intervals, sinus rhythm, no ST segment or T wave changes.  Patient with high oxygen requirements,per non re-breather and heated high flow nasal cannula, she has received Tocilizumab, convalescent plasma, remdesivir and systemic corticosteroids. Her family has requested hydroxychloroquine.  Patienthas continued on very high oxygen requirements and increased work of breathing.  CODE status has been changed to DNR.   Patient with worsening bilateral  pulmonary infiltrates with increase 02 requirements to 60 L/ min with trending down oxymetry to the low 70's  Assessment & Plan:   Principal Problem:   Acute hypoxemic respiratory failure due to COVID-19 [facility]) Active Problems:   Pneumonia due to COVID-19 virus   DVT (deep venous thrombosis) (HCC)   Type 2 diabetes mellitus with other specified complication (HCC)   Class 2 obesity   1.Acute hypoxic respiratory failure due to SARS COVID-19 viral pneumonia.Sp Tocilizumab 05/14, covid 19 convalescent plasma 05/15, remdesivir #5.  RR: 30  Pulse oxymetry:72%-79% Fi02: 60 L/ min HFNC and NRBP  COVID-19 Labs  Recent Labs    11/20/19 0313 11/21/19 0340 11/22/19 0257  DDIMER 19.75* >20.00* >20.00*  FERRITIN 410* 553*  --   CRP 0.6 0.6  --     Lab Results  Component Value Date   SARSCOV2NAA POSITIVE (A) 11/16/2019    Continue to have high oxygen requirements (60 L/min) with persistent hypoxemia (70's),  she does have increase work of breathing and rapid desaturation down to the 50 with minimal efforts.   Had diuresis for acute non cardiogenic pulmonary edema, with no significant response with gas exchange. Urine output 1,600 ml over last 24 H.   On systemic corticosteroids with methylprednisolone 40 mg IV q 24 H,airway clearing techniques with incentive spirometer and flutter valve, bronchodilator therapy and antitussive agents.Will add inhaled steroids and long acting B agonist.   Continue to encourage out of bed to chair tid with meals and prone position as tolerated while in bed.   Patient continue to have poor oral intake and progressive decline in functional physical capacity, continue nutritional supplements.  Patient with very poor prognosis and worsening hypoxemic respiratory failure.   2. Acute left DVT. Positive dvt left gastrocnemius. Tolerating well full anticoagulation with enoxaparin.   2. Uncontrolled T2DM (Hgb A1c 8,1) with  steroid induced  hyperglycemia. Fasting glucose 167,very poor oral intake.   Continue with daily basal insulin 14 units and insulin sliding scale for glucose cover and monitoring. Continue 3 units with pre-meal insulin. Continue with linagliptin.   3. Obesity class 2. Calculated BMI 31,0, very poor mobility/   4. Hypokalemia. Stable renal function with serum cr at 0,58 with K at 3,9 and serum bicarbonate at 28. Will continue close follow up of renal functio and electrolytes,    5. Depression. Resumed fluoxetine.   Patient with worsening respiratory failure, will continue current care to prevent imminent deterioration.  Critical care time 60 minutes.   Status is: Inpatient  Remains inpatient appropriate because:IV treatments appropriate due to intensity of illness or inability to take PO   Dispo: The patient is from: Home              Anticipated d/c is to: SNF              Anticipated d/c date is: > 3 days              Patient currently is not medically stable to d/c.    DVT prophylaxis: Enoxaparin   Code Status:   dnr   Family Communication:  No family at the bedside      Nutrition Status: Nutrition Problem: Increased nutrient needs Etiology: acute illness, catabolic XKGYJEH(UDJSH-70 infection) Signs/Symptoms: estimated needs Interventions: Ensure Enlive (each supplement provides 350kcal and 20 grams of protein), Prostat, MVI    Consultants:   Pulmonary     Subjective: Patient continue to be very fatigue and deconditioned, positive dyspnea with minimal efforts, poor oral intake. Somnolent at times.   Objective: Vitals:   11/22/19 0400 11/22/19 0430 11/22/19 0500 11/22/19 0800  BP: (!) 129/103   137/86  Pulse: 84 93 98 (!) 105  Resp: (!) 29 (!) 33 (!) 29 (!) 30  Temp:      TempSrc:      SpO2: (!) 70% (!) 73% (!) 79% (!) 72%  Weight:      Height:        Intake/Output Summary (Last 24 hours) at 11/22/2019 0823 Last data filed at 11/22/2019 0420 Gross per 24 hour  Intake  350 ml  Output 1600 ml  Net -1250 ml   Filed Weights   Dec 02, 2019 1402 12/02/19 2106 11/21/19 1411  Weight: 72.6 kg 82.2 kg 79.6 kg    Examination:   General: positive dyspnea and increased work of breathing.  Neurology: Awake and alert, non focal  E ENT: positive pallor, no icterus, oral mucosa moist Cardiovascular: No JVD. S1-S2 present, rhythmic, no gallops, rubs, or murmurs. No lower extremity edema. Pulmonary: positive breath sounds bilaterally. Gastrointestinal. Abdomen soft with no organomegaly, non tender, no rebound or guarding Skin. No rashes Musculoskeletal: no joint deformities     Data Reviewed: I have personally reviewed following labs and imaging studies  CBC: Recent Labs  Lab 11/16/19 0309 11/17/19 0224 11/18/19 0236 11/19/19 0215  WBC 18.9* 16.2* 13.9* 16.7*  NEUTROABS 16.1* 13.3* 11.7* 14.5*  HGB 13.3 13.2 13.8 14.3  HCT 37.5 37.3 39.1 41.5  MCV 86.0 85.9 88.3 87.7  PLT 213 197 198 263*   Basic Metabolic Panel: Recent Labs  Lab 11/18/19 0236 11/19/19 0215 11/20/19 0313 11/21/19 0340 11/22/19 0257  NA 141 143 139 140 138  K 4.7 3.9 3.2* 4.6 3.9  CL 105 106 102 103 99  CO2 25 23 26 26 28   GLUCOSE 322* 185* 164* 166*  167*  BUN 24* 18 19 19 22   CREATININE 0.84 0.55 0.49 0.65 0.58  CALCIUM 8.3* 8.3* 8.1* 8.1* 8.2*   GFR: Estimated Creatinine Clearance: 73.5 mL/min (by C-G formula based on SCr of 0.58 mg/dL). Liver Function Tests: Recent Labs  Lab 11/18/19 0236 11/19/19 0215 11/20/19 0313 11/21/19 0340 11/22/19 0257  AST 38 32 28 42* 39  ALT 33 26 20 21 17   ALKPHOS 111 144* 163* 225* 260*  BILITOT 1.3* 0.9 1.1 1.7* 1.8*  PROT 6.1* 5.9* 6.0* 6.1* 6.1*  ALBUMIN 2.7* 2.9* 2.9* 3.1* 3.1*   No results for input(s): LIPASE, AMYLASE in the last 168 hours. No results for input(s): AMMONIA in the last 168 hours. Coagulation Profile: No results for input(s): INR, PROTIME in the last 168 hours. Cardiac Enzymes: No results for input(s):  CKTOTAL, CKMB, CKMBINDEX, TROPONINI in the last 168 hours. BNP (last 3 results) No results for input(s): PROBNP in the last 8760 hours. HbA1C: No results for input(s): HGBA1C in the last 72 hours. CBG: Recent Labs  Lab 11/21/19 0853 11/21/19 1242 11/21/19 1617 11/21/19 2135 11/22/19 0818  GLUCAP 229* 285* 219* 248* 276*   Lipid Profile: No results for input(s): CHOL, HDL, LDLCALC, TRIG, CHOLHDL, LDLDIRECT in the last 72 hours. Thyroid Function Tests: No results for input(s): TSH, T4TOTAL, FREET4, T3FREE, THYROIDAB in the last 72 hours. Anemia Panel: Recent Labs    11/20/19 0313 11/21/19 0340  FERRITIN 410* 553*      Radiology Studies: I have reviewed all of the imaging during this hospital visit personally     Scheduled Meds: . vitamin C  500 mg Oral Daily  . Chlorhexidine Gluconate Cloth  6 each Topical Daily  . chlorpheniramine-HYDROcodone  5 mL Oral Q12H  . enoxaparin (LOVENOX) injection  1 mg/kg Subcutaneous Q12H  . feeding supplement (ENSURE ENLIVE)  237 mL Oral BID BM  . feeding supplement (PRO-STAT SUGAR FREE 64)  30 mL Oral Daily  . FLUoxetine  10 mg Oral Daily  . insulin aspart  0-15 Units Subcutaneous TID WC  . insulin aspart  0-5 Units Subcutaneous QHS  . insulin aspart  3 Units Subcutaneous TID WC  . insulin detemir  14 Units Subcutaneous Daily  . Ipratropium-Albuterol  1 puff Inhalation QID  . linagliptin  5 mg Oral Daily  . mouth rinse  15 mL Mouth Rinse BID  . methylPREDNISolone (SOLU-MEDROL) injection  40 mg Intravenous Daily  . multivitamin with minerals  1 tablet Oral Daily  . zinc sulfate  220 mg Oral Daily   Continuous Infusions:   LOS: 8 days        Caydance Kuehnle 11/22/19, MD

## 2019-11-23 ENCOUNTER — Inpatient Hospital Stay (HOSPITAL_COMMUNITY): Payer: HRSA Program

## 2019-11-23 LAB — COMPREHENSIVE METABOLIC PANEL
ALT: 20 U/L (ref 0–44)
AST: 46 U/L — ABNORMAL HIGH (ref 15–41)
Albumin: 3.6 g/dL (ref 3.5–5.0)
Alkaline Phosphatase: 358 U/L — ABNORMAL HIGH (ref 38–126)
Anion gap: 16 — ABNORMAL HIGH (ref 5–15)
BUN: 21 mg/dL (ref 8–23)
CO2: 27 mmol/L (ref 22–32)
Calcium: 8.7 mg/dL — ABNORMAL LOW (ref 8.9–10.3)
Chloride: 98 mmol/L (ref 98–111)
Creatinine, Ser: 0.63 mg/dL (ref 0.44–1.00)
GFR calc Af Amer: >60 mL/min (ref >60)
GFR calc non Af Amer: >60 mL/min (ref >60)
Glucose, Bld: 248 mg/dL — ABNORMAL HIGH (ref 70–99)
Potassium: 3.5 mmol/L (ref 3.5–5.1)
Sodium: 141 mmol/L (ref 135–145)
Total Bilirubin: 1.9 mg/dL — ABNORMAL HIGH (ref 0.3–1.2)
Total Protein: 6.8 g/dL (ref 6.5–8.1)

## 2019-11-23 LAB — FERRITIN: Ferritin: 512 ng/mL — ABNORMAL HIGH (ref 11–307)

## 2019-11-23 LAB — D-DIMER, QUANTITATIVE: D-Dimer, Quant: 17.66 ug/mL-FEU — ABNORMAL HIGH (ref 0.00–0.50)

## 2019-11-23 LAB — C-REACTIVE PROTEIN: CRP: 4.5 mg/dL — ABNORMAL HIGH (ref <1.0)

## 2019-11-23 MED ORDER — MORPHINE SULFATE (PF) 2 MG/ML IV SOLN: 2.0000 mg | Freq: Once | INTRAVENOUS | Status: DC

## 2019-11-23 MED ORDER — FUROSEMIDE 10 MG/ML IJ SOLN
40.0000 mg | Freq: Once | INTRAMUSCULAR | Status: AC
  Administered 2019-11-23: 40 mg via INTRAVENOUS
  Filled 2019-11-23: qty 4

## 2019-11-23 MED ORDER — HALOPERIDOL LACTATE 5 MG/ML IJ SOLN: 0.5000 mg | INTRAMUSCULAR | Status: DC | PRN

## 2019-11-23 MED ORDER — POLYVINYL ALCOHOL 1.4 % OP SOLN
1.0000 [drp] | Freq: Four times a day (QID) | OPHTHALMIC | Status: DC | PRN
  Filled 2019-11-23: qty 15

## 2019-11-23 MED ORDER — ONDANSETRON 4 MG PO TBDP: 4.0000 mg | ORAL_TABLET | Freq: Four times a day (QID) | ORAL | Status: DC | PRN

## 2019-11-23 MED ORDER — LORAZEPAM 2 MG/ML IJ SOLN
1.0000 mg | INTRAMUSCULAR | Status: DC | PRN
  Administered 2019-11-23: 1 mg via INTRAVENOUS
  Filled 2019-11-23: qty 1

## 2019-11-23 MED ORDER — GLYCOPYRROLATE 0.2 MG/ML IJ SOLN: 0.2000 mg | INTRAMUSCULAR | Status: DC | PRN

## 2019-11-23 MED ORDER — HALOPERIDOL 1 MG PO TABS: 0.5000 mg | ORAL_TABLET | ORAL | Status: DC | PRN

## 2019-11-23 MED ORDER — ACETAMINOPHEN 325 MG PO TABS: 650.0000 mg | ORAL_TABLET | Freq: Four times a day (QID) | ORAL | Status: DC | PRN

## 2019-11-23 MED ORDER — ACETAMINOPHEN 650 MG RE SUPP: 650.0000 mg | Freq: Four times a day (QID) | RECTAL | Status: DC | PRN

## 2019-11-23 MED ORDER — ONDANSETRON HCL 4 MG/2ML IJ SOLN: 4.0000 mg | Freq: Four times a day (QID) | INTRAMUSCULAR | Status: DC | PRN

## 2019-11-23 MED ORDER — LORAZEPAM 2 MG/ML PO CONC
1.0000 mg | ORAL | Status: DC | PRN
  Filled 2019-11-23: qty 0.5

## 2019-11-23 MED ORDER — MORPHINE 100MG IN NS 100ML (1MG/ML) PREMIX INFUSION
1.0000 mg/h | INTRAVENOUS | Status: DC
  Administered 2019-11-23: 1 mg/h via INTRAVENOUS
  Filled 2019-11-23: qty 100

## 2019-11-23 MED ORDER — HALOPERIDOL LACTATE 2 MG/ML PO CONC
0.5000 mg | ORAL | Status: DC | PRN
  Filled 2019-11-23: qty 0.3

## 2019-11-23 MED ORDER — BIOTENE DRY MOUTH MT LIQD: 15.0000 mL | OROMUCOSAL | Status: DC | PRN

## 2019-11-23 MED ORDER — GLYCOPYRROLATE 1 MG PO TABS: 1.0000 mg | ORAL_TABLET | ORAL | Status: DC | PRN

## 2019-11-23 MED ORDER — LORAZEPAM 1 MG PO TABS: 1.0000 mg | ORAL_TABLET | ORAL | Status: DC | PRN

## 2019-11-26 ENCOUNTER — Encounter: Payer: Self-pay | Admitting: Urology

## 2019-12-02 NOTE — Progress Notes (Signed)
Worsening O2 (30-70s) causing pt more and more anxiety. Pt refusing proning and lying on her side now d/t discomfort. Pt currently sitting up @ 50 degree angle sating 50-60s  Husband Molly Maduro) and Friend Arline Asp) have been updated.

## 2019-12-02 NOTE — Progress Notes (Signed)
eLink Physician-Brief Progress Note Patient Name: Katrina Willis DOB: 1956/04/12 MRN: [id number]   Date of Service  12-05-2019  HPI/Events of Note  Sat = 67% on HFNC O2 + 100% NRBM. RR = 27. Creatinine = 0.58.   eICU Interventions  Plan: 1. Lasix 40 mg IV X 1.      Intervention Category Major Interventions: Respiratory failure - evaluation and management  [name] 12/05/19, 4:08 AM

## 2019-12-02 NOTE — Death Summary Note (Signed)
Death Summary  Katrina Willis DPO:[id number] DOB: 1955/07/17 DOA: Dec 11, 2019  PCP: Patient, No Pcp Per  Admit date: 2019-12-11 Date of Death: 20-Dec-2019 Time of Death: 10:22 am  Notification: Patient, No Pcp Per notified of death of 12-20-19   History of present illness:  Katrina Willis is a 64 y.o. female with a history of T2DM.  Patient admitted to the hospital with the working diagnosis of acute hypoxic respiratory failure due to SARS COVID-19 viral pneumonia.  64 year old female who presented with dyspnea. She does have significant past medical history for type 2 diabetes mellitus. She resides in [place] and is visiting [place] since 5/4. For the last 10 days she has been experiencing flulike symptoms. Positive fatigue, weakness, andpoor appetite.For the last 24 hours prior to hospitalization she developed significant worsening dyspnea.She tested positive for COVID-19 11/13/2019. On her initial physical examination blood pressure 123/70, heart rate 77, respiratory rate 32, oxygen saturation 90%, she had bibasilar rhonchi, no wheezing, positive tachypnea and increased work of breathing, heart S1-S2, present rhythmic, abdomen soft, no lower extremity edema. Sodium 133, potassium 3.7, chloride 96, bicarb 22, glucose 392, BUN 16, creatinine 0.77, white count 12.5, hemoglobin 14.5, hematocrit 40.6, platelets 203. SARS COVID-19 positive. Her chest radiograph had diffuse bilateral interstitial infiltrates, upper lobes lower lobes, predominantly at bases and peripherally. CT chest with diffuse bilateral groundglass opacities, more dense opacities at bases bilaterally. Negative for pulmonary embolism. EKG 76 bpm, left axis deviation normal intervals, sinus rhythm, no ST segment or T wave changes.  Patient with high oxygen requirements,per non re-breather and heated high flow nasal cannula, she has received Tocilizumab, convalescent plasma, remdesivir and systemic corticosteroids. Her  family has requested hydroxychloroquine.  Patienthas continued on very high oxygen requirements and increased work of breathing.  CODE status has been changed to DNR.  Patient with worsening bilateral pulmonary infiltrates with increase 02 requirements to 60 L/ min with trending down oxymetry to the low 70's.  Her oxymetry continue to deteriorate despite aggressive medical therapy and high flow oxygen supplementation. Her work of breathing has significantly increased. Last night severe oxygen desaturation down to 50%. Patient did talk to her family face time through her cell phone.   This am she continue to decline invasive mechanical ventilation. She is aware of her poor prognosis and imminent death.    Patient with worsening work of breathing, she looks in severe respiratory distress. Her chest radiograph this am was personally reviewed with bilateral interstitial infiltrates, all 4 quadrants. Hypoxemia has been refractive to aggressive medical therapy including diuresis.   We talk about her worsening condition and very poor prognosis. I offered her the option of invasive mechanical ventilation, but she continue to decline this intervention. She is aware there is no guarantee that mechanical ventilation will bee successful and that will likely prolong her suffering. Patient is aware of imminent death.   I talked to her husband through her cell phone, by face time. He was informed about Katrina Willis decision and he confirms that she does not want to be intubated. I explain to him that death is imminent and will focus her care in comfort and relieve any suffering. He is in agreement with this plan. He was able to see his wife through the cell phone camera and all his questions were addressed.  Will start patient on comfort measures including an continuous infusion of IV morphine and as needed lorazepam. Will continue supplemental 02 for comfort and as needed bronchodilators.   2.  Acute left DVT. Positive dvt left gastrocnemius. Will discontinue anticoagulation now, patient on comfort care.   2. Uncontrolled T2DM (Hgb A1c 8,1) with steroid induced hyperglycemia.  Patient with very poor oral intake, will hold on capillary glucose monitoring and insulin therapy.   3. Obesity class 2. Calculated BMI 31,0.   4. Hypokalemia.Will discontinue all blood work.   5. Depression. Continue with fluoxetine as tolerated.   Final Diagnoses:  1.  SARS COVID 19 viral pnuemonia 2.  Severe acute hypoxic respiratory failure 3.   Left leg deep vein thrombosis 4.   T2DM    The results of significant diagnostics from this hospitalization (including imaging, microbiology, ancillary and laboratory) are listed below for reference.    Significant Diagnostic Studies: DG Chest 1 View  Result Date: 11/22/2019 CLINICAL DATA:  Dyspnea EXAM: CHEST  1 VIEW COMPARISON:  11/21/2019 FINDINGS: Cardiac shadow is stable. Diffuse parenchymal opacity is again identified bilaterally stable in appearance from the prior exam. No new effusion or pneumothorax is seen. No bony abnormality is noted. IMPRESSION: Stable diffuse parenchymal opacity when compared with the prior exam. Electronically Signed   By: Alcide Clever M.D.   On: 11/02/2019 09:20   DG Chest 1 View  Result Date: 11/21/2019 CLINICAL DATA:  Dyspnea, COVID-19, diabetes mellitus EXAM: CHEST  1 VIEW COMPARISON:  Portable exam 0837 hours compared to 11/18/2019 FINDINGS: Upper normal heart size. Mediastinal contours unchanged. Diffuse BILATERAL airspace infiltrates increased since previous exam, question multifocal pneumonia versus ARDS. No pleural effusion or pneumothorax. Osseous structures unremarkable. IMPRESSION: Diffuse BILATERAL airspace infiltrates question multifocal pneumonia versus ARDS. Electronically Signed   By: Ulyses Southward M.D.   On: 11/21/2019 08:56   CT ANGIO CHEST PE W OR WO CONTRAST  Result Date: 11/15/2019 CLINICAL DATA:   COVID positive with increasing shortness of breath. Rising D-dimer. EXAM: CT ANGIOGRAPHY CHEST WITH CONTRAST TECHNIQUE: Multidetector CT imaging of the chest was performed using the standard protocol during bolus administration of intravenous contrast. Multiplanar CT image reconstructions and MIPs were obtained to evaluate the vascular anatomy. CONTRAST:  OMNIPAQUE IOHEXOL 350 MG/ML SOLN COMPARISON:  Radiograph yesterday. FINDINGS: Cardiovascular: Motion artifact limits assessment distal to the segmental branches. There are no central filling defects in the pulmonary arteries to suggest pulmonary embolus. Distal branches are not well assessed given motion. Aortic atherosclerosis without aneurysm or dissection. Upper normal heart size. No pericardial effusion. Mediastinum/Nodes: Few shotty mediastinal and hilar lymph nodes that are not enlarged by size criteria. No thyroid nodule. The esophagus is patulous without wall thickening. Lungs/Pleura: Widespread bilateral ground-glass opacities throughout both lungs with slight apical sparing. Scattered superimposed consolidations in the bases. No pleural fluid. No pneumothorax. Upper Abdomen: No acute findings. Musculoskeletal: There are no acute or suspicious osseous abnormalities. Review of the MIP images confirms the above findings. IMPRESSION: 1. No central pulmonary embolus. Motion artifact limits assessment distal to the segmental branches. 2. Widespread bilateral ground-glass opacities throughout both lungs, pattern most consistent with COVID-19 pneumonia. Parenchymal involvement is extensive. Aortic Atherosclerosis (ICD10-I70.0). Electronically Signed   By: Narda Rutherford M.D.   On: 11/15/2019 17:37   DG CHEST PORT 1 VIEW  Result Date: 11/18/2019 CLINICAL DATA:  Hypoxemia EXAM: PORTABLE CHEST 1 VIEW COMPARISON:  11/26/19 FINDINGS: Bilateral interstitial and alveolar airspace opacities. No pleural effusion or pneumothorax. Stable cardiomediastinal  silhouette. No aggressive osseous lesion. IMPRESSION: Bilateral interstitial and alveolar airspace opacities may reflect pulmonary edema or multifocal pneumonia. Electronically Signed   By: Elige Ko   On:  11/18/2019 11:27   DG Chest Port 1 View  Result Date: 11/12/2019 CLINICAL DATA:  Cough, shortness of breath, and headache. COVID positive. EXAM: PORTABLE CHEST 1 VIEW COMPARISON:  None. FINDINGS: The heart size and mediastinal contours are within normal limits. Normal pulmonary vascularity. Bilateral mid and lower lung interstitial and hazy airspace opacities. No pleural effusion or pneumothorax. No acute osseous abnormality. IMPRESSION: 1. Multifocal pneumonia consistent with history of COVID-19 infection. Electronically Signed   By: Obie DredgeWilliam T Derry M.D.   On: 11/29/2019 13:01   VAS US LOWER EXTREMITY VENOUS (DVT)  Result Date: 11/16/2019  Lower Venous DVTStudy Indications: COVID and d dimer.  Comparison Study: none Performing Technologist: Jeb LeveringJill Parker RDMS, RVT  Examination Guidelines: A complete evaluation includes B-mode imaging, spectral Doppler, color Doppler, and power Doppler as needed of all accessible portions of each vessel. Bilateral testing is considered an integral part of a complete examination. Limited examinations for reoccurring indications may be performed as noted. The reflux portion of the exam is performed with the patient in reverse Trendelenburg.  +---------+---------------+---------+-----------+----------+--------------+ RIGHT    CompressibilityPhasicitySpontaneityPropertiesThrombus Aging +---------+---------------+---------+-----------+----------+--------------+ CFV      Full           Yes      Yes                                 +---------+---------------+---------+-----------+----------+--------------+ SFJ      Full                                                        +---------+---------------+---------+-----------+----------+--------------+ FV Prox   Full                                                        +---------+---------------+---------+-----------+----------+--------------+ FV Mid   Full                                                        +---------+---------------+---------+-----------+----------+--------------+ FV DistalFull                                                        +---------+---------------+---------+-----------+----------+--------------+ PFV      Full                                                        +---------+---------------+---------+-----------+----------+--------------+ POP      Full           Yes      Yes                                 +---------+---------------+---------+-----------+----------+--------------+  PTV      Full                                                        +---------+---------------+---------+-----------+----------+--------------+ PERO     Full                                                        +---------+---------------+---------+-----------+----------+--------------+   +---------+---------------+---------+-----------+----------+--------------+ LEFT     CompressibilityPhasicitySpontaneityPropertiesThrombus Aging +---------+---------------+---------+-----------+----------+--------------+ CFV      Full           Yes      Yes                                 +---------+---------------+---------+-----------+----------+--------------+ SFJ      Full                                                        +---------+---------------+---------+-----------+----------+--------------+ FV Prox  Full                                                        +---------+---------------+---------+-----------+----------+--------------+ FV Mid   Full                                                        +---------+---------------+---------+-----------+----------+--------------+ FV DistalFull                                                         +---------+---------------+---------+-----------+----------+--------------+ PFV      Full                                                        +---------+---------------+---------+-----------+----------+--------------+ POP      Full           Yes      Yes                                 +---------+---------------+---------+-----------+----------+--------------+ PTV      Full                                                        +---------+---------------+---------+-----------+----------+--------------+  PERO     Full                                                        +---------+---------------+---------+-----------+----------+--------------+ Gastroc  None                                         Acute          +---------+---------------+---------+-----------+----------+--------------+     Summary: RIGHT: - There is no evidence of deep vein thrombosis in the lower extremity.  - No cystic structure found in the popliteal fossa.  LEFT: - Findings consistent with acute deep vein thrombosis involving the left gastrocnemius veins. - No cystic structure found in the popliteal fossa.  *See table(s) above for measurements and observations. Electronically signed by Fabienne Bruns MD on 11/16/2019 at 6:45:50 PM.    Final     Microbiology: Recent Results (from the past 240 hour(s))  Blood Culture (routine x 2)     Status: None   Collection Time: 11-18-19 12:40 PM   Specimen: BLOOD  Result Value Ref Range Status   Specimen Description   Final    BLOOD RIGHT ANTECUBITAL Performed at [facility], [address] [place], [place] [address]    Special Requests   Final    BOTTLES DRAWN AEROBIC AND ANAEROBIC Blood Culture adequate volume Performed at [facility], [address] [place], [place] [address]    Culture   Final    NO GROWTH 5 DAYS Performed at [facility], [address], [place], [place] [address]      Report Status 11/19/2019 FINAL  Final  Blood Culture (routine x 2)     Status: None   Collection Time: Nov 18, 2019 12:45 PM   Specimen: BLOOD  Result Value Ref Range Status   Specimen Description   Final    BLOOD RIGHT ANTECUBITAL Performed at [facility], [address] [place], [place] [address]    Special Requests   Final    BOTTLES DRAWN AEROBIC AND ANAEROBIC Blood Culture adequate volume Performed at [facility], [address] [place], [place] [address]    Culture   Final    NO GROWTH 5 DAYS Performed at [facility], [address], [place], [place] [address]    Report Status 11/19/2019 FINAL  Final  SARS CORONAVIRUS 2 (TAT 6-24 HRS) Nasopharyngeal Nasopharyngeal Swab     Status: Abnormal   Collection Time: 11/18/19  2:07 PM   Specimen: Nasopharyngeal Swab  Result Value Ref Range Status   SARS Coronavirus 2 POSITIVE (A) NEGATIVE Final    Comment: RESULT CALLED TO, READ BACK BY AND VERIFIED WITH: RN GARCIA KELLY AT 2315 BY MESSAN HOUEGNIFIO ON 11/15/2019 (NOTE) SARS-CoV-2 target nucleic acids are DETECTED. The SARS-CoV-2 RNA is generally detectable in upper and lower respiratory specimens during the acute phase of infection. Positive results are indicative of the presence of SARS-CoV-2 RNA. Clinical correlation with patient history and other diagnostic information is  necessary to determine patient infection status. Positive results do not rule out bacterial infection or co-infection with other viruses.  The expected result is Negative. Fact Sheet for Patients: HairSlick.no Fact Sheet for  Healthcare Providers: quierodirigir.com This test is not yet approved or cleared by the [place] and  has been authorized for detection and/or diagnosis of SARS-CoV-2 by FDA under an Emergency Use Authorization (EUA). This EUA will remain  in effect (meaning this tes t can be  used) for the duration of the COVID-19 declaration under Section 564(b)(1) of the Act, 21 U.S.C. section 360bbb-3(b)(1), unless the authorization is terminated or revoked sooner. Performed at [facility], [address] [place], [place] [address]   MRSA PCR Screening     Status: None   Collection Time: 11/15/19  1:06 AM   Specimen: Nasal Mucosa; Nasopharyngeal  Result Value Ref Range Status   MRSA by PCR NEGATIVE NEGATIVE Final    Comment:        The GeneXpert MRSA Assay (FDA approved for NASAL specimens only), is one component of a comprehensive MRSA colonization surveillance program. It is not intended to diagnose MRSA infection nor to guide or monitor treatment for MRSA infections. Performed at [facility], [address], [place], [place] [address]      Labs: Basic Metabolic Panel: Recent Labs  Lab 11/19/19 0215 11/19/19 0215 11/20/19 1937 11/20/19 0313 11/21/19 0340 11/21/19 0340 11/22/19 0257 11/24/2019 0544  NA 143  --  139  --  140  --  138 141  K 3.9   < > 3.2*   < > 4.6   < > 3.9 3.5  CL 106  --  102  --  103  --  99 98  CO2 23  --  26  --  26  --  28 27  GLUCOSE 185*  --  164*  --  166*  --  167* 248*  BUN 18  --  19  --  19  --  22 21  CREATININE 0.55  --  0.49  --  0.65  --  0.58 0.63  CALCIUM 8.3*  --  8.1*  --  8.1*  --  8.2* 8.7*   < > = values in this interval not displayed.   Liver Function Tests: Recent Labs  Lab 11/19/19 0215 11/20/19 0313 11/21/19 0340 11/22/19 0257 11/30/2019 0544  AST 32 28 42* 39 46*  ALT 26 20 21 17 20   ALKPHOS 144* 163* 225* 260* 358*  BILITOT 0.9 1.1 1.7* 1.8* 1.9*  PROT 5.9* 6.0* 6.1* 6.1* 6.8  ALBUMIN 2.9* 2.9* 3.1* 3.1* 3.6   No results for input(s): LIPASE, AMYLASE in the last 168 hours. No results for input(s): AMMONIA in the last 168 hours. CBC: Recent Labs  Lab 11/17/19 0224 11/18/19 0236 11/19/19 0215  WBC 16.2* 13.9* 16.7*  NEUTROABS 13.3* 11.7* 14.5*  HGB 13.2 13.8  14.3  HCT 37.3 39.1 41.5  MCV 85.9 88.3 87.7  PLT 197 198 133*   Cardiac Enzymes: No results for input(s): CKTOTAL, CKMB, CKMBINDEX, TROPONINI in the last 168 hours. D-Dimer Recent Labs    11/22/19 0257 11/15/2019 0544  DDIMER >20.00* 17.66*   BNP: Invalid input(s): POCBNP CBG: Recent Labs  Lab 11/22/19 0818 11/22/19 1316 11/22/19 1653 11/22/19 1655 11/22/19 2137  GLUCAP 276* 282* 406* 348* 209*   Anemia work up Recent Labs    11/21/19 0340 11/18/2019 0544  FERRITIN 553* 512*   Urinalysis    Component Value Date/Time   COLORURINE YELLOW 11/21/2019 1625   APPEARANCEUR HAZY (A) 11/21/2019 1625   LABSPEC 1.014 11/21/2019 1625   PHURINE 5.0 11/21/2019 1625   GLUCOSEU NEGATIVE  11/21/2019 1625   HGBUR NEGATIVE 11/21/2019 1625   BILIRUBINUR NEGATIVE 11/21/2019 1625   KETONESUR NEGATIVE 11/21/2019 1625   PROTEINUR NEGATIVE 11/21/2019 1625   NITRITE POSITIVE (A) 11/21/2019 1625   LEUKOCYTESUR NEGATIVE 11/21/2019 1625   Sepsis Labs Invalid input(s): PROCALCITONIN,  WBC,  LACTICIDVEN     SIGNED:  Coralie Keens, MD  [facility] 11/06/2019, 2:22 PM Pager   If 7PM-7AM, please contact night-coverage [email or web address] Password [email or web address]

## 2019-12-02 NOTE — Death Summary Note (Addendum)
Pt Hr trending down on monitor, RN entered room to find pt resting comfortably w/ shallow breaths. Monitor rang asystole 2 RNs (Sarah F, Antonia Culbertson H) auscultated for heart and lungs sounds x60min. No sounds auscultated. Time of death pronounced at 1022am on 2022/12/03 and confirmed by central telemetry  Next of kin Charnel Giles notified  Per husband, Antonietta Jewel (Friend) will be handling ALL detail regarding the pt billing and transfer to [place].  Verbal consent witnessed by 2 RNs (Sarah F, Daymian Lill H) via phone call  Pt belongings taken by Antonietta Jewel

## 2019-12-02 NOTE — Progress Notes (Addendum)
PROGRESS NOTE    GABRIANNA FASSNACHT  NZV:[id number] DOB: Nov 09, 1955 DOA: 11/22/2019 PCP: Patient, No Pcp Per    Brief Narrative:  Patient admitted to the hospital with the working diagnosis of acute hypoxic respiratory failure due to SARS COVID-19 viral pneumonia.  64 year old female who presented with dyspnea. She does have significant past medical history for type 2 diabetes mellitus. She resides in [place] and is visiting [place] since 5/4. For the last 10 days she has been experiencing flulike symptoms. Positive fatigue, weakness, andpoor appetite.For the last 24 hours prior to hospitalization she developed significant worsening dyspnea.She tested positive for COVID-19 11/13/2019. On her initial physical examination blood pressure 123/70, heart rate 77, respiratory rate 32, oxygen saturation 90%, she had bibasilar rhonchi, no wheezing, positive tachypnea and increased work of breathing, heart S1-S2, present rhythmic, abdomen soft, no lower extremity edema. Sodium 133, potassium 3.7, chloride 96, bicarb 22, glucose 392, BUN 16, creatinine 0.77, white count 12.5, hemoglobin 14.5, hematocrit 40.6, platelets 203. SARS COVID-19 positive. Her chest radiograph had diffuse bilateral interstitial infiltrates, upper lobes lower lobes, predominantly at bases and peripherally. CT chest with diffuse bilateral groundglass opacities, more dense opacities at bases bilaterally. Negative for pulmonary embolism. EKG 76 bpm, left axis deviation normal intervals, sinus rhythm, no ST segment or T wave changes.  Patient with high oxygen requirements,per non re-breather and heated high flow nasal cannula, she has received Tocilizumab, convalescent plasma, remdesivir and systemic corticosteroids. Her family has requested hydroxychloroquine.  Patienthas continued on very high oxygen requirements and increased work of breathing.  CODE status has been changed to DNR.  Patient with worsening bilateral  pulmonary infiltrates with increase 02 requirements to 60 L/ min with trending down oxymetry to the low 70's.  Her oxymetry continue to deteriorate despite aggressive medical therapy and high flow oxygen supplementation. Her work of breathing has significantly increased. Last night severe oxygen desaturation down to 50%. Patient did talk to her family face time through her cell phone.   This am she continue to decline invasive mechanical ventilation. She is aware of her poor prognosis and imminent death.   Assessment & Plan:   Principal Problem:   Acute hypoxemic respiratory failure due to COVID-19 [facility]) Active Problems:   Pneumonia due to COVID-19 virus   DVT (deep venous thrombosis) (HCC)   Type 2 diabetes mellitus with other specified complication (HCC)   Class 2 obesity   1.Acute hypoxic respiratory failure due to SARS COVID-19 viral pneumonia.Sp Tocilizumab 05/14, covid 19 convalescent plasma 05/15, remdesivir #5.  RR: 32  Pulse oxymetry: 50 to 60% Fi02: 60 L/ min per HFNC and NRBM  COVID-19 Labs  Recent Labs    11/21/19 0340 11/22/19 0257 12-09-2019 0544  DDIMER >20.00* >20.00* 17.66*  FERRITIN 553*  --  512*  CRP 0.6  --  4.5*    Lab Results  Component Value Date   SARSCOV2NAA POSITIVE (A) 11/11/2019    Patient with worsening work of breathing, she looks in severe respiratory distress. Her chest radiograph this am was personally reviewed with bilateral interstitial infiltrates, all 4 quadrants. Hypoxemia has been refractive to aggressive medical therapy including diuresis.   We talk about her worsening condition and very poor prognosis. I offered her the option of invasive mechanical ventilation, but she continue to decline this intervention. She is aware there is no guarantee that mechanical ventilation will bee successful and that will likely prolong her suffering. Patient is aware of imminent death.   I talked to her husband  through her cell phone, by face  time. He was informed about Mrs. Tunison decision and he confirms that she does not want to be intubated. I explain to him that death is imminent and will focus her care in comfort and relieve any suffering. He is in agreement with this plan. He was able to see his wife through the cell phone camera and all his questions were addressed.  Will start patient on comfort measures including an continuous infusion of IV morphine and as needed lorazepam. Will continue supplemental 02 for comfort and as needed bronchodilators.   2. Acute left DVT. Positive dvt left gastrocnemius. Will discontinue anticoagulation now, patient on comfort care.   2. Uncontrolled T2DM (Hgb A1c 8,1) with steroid induced hyperglycemia.   Patient with very poor oral intake, will hold on capillary glucose monitoring and insulin therapy.   3. Obesity class 2. Calculated BMI 31,0.   4. Hypokalemia.Will discontinue all blood work.   5. Depression. Continue with fluoxetine as tolerated.   Patient with worsening respiratory failure, death now is imminent.   Critical care time 60 minutes.   Status is: Inpatient  Remains inpatient appropriate because:IV treatments appropriate due to intensity of illness or inability to take PO   Dispo: The patient is from: Home              Anticipated d/c is to: immnent death              Anticipated d/c date is: 1 day              Patient currently is not medically stable to d/c.   DVT prophylaxis: None (comfort care)  Code Status:   DNR   Family Communication:  I spoke over the phone with the patient's husband about patient's  condition, plan of care, prognosis and all questions were addressed.     Nutrition Status: Nutrition Problem: Increased nutrient needs Etiology: acute illness, catabolic illness(COVID-19 infection) Signs/Symptoms: estimated needs Interventions: Ensure Enlive (each supplement provides 350kcal and 20 grams of protein), Prostat, MVI     Subjective: Patient with significant increase work of breathing, very fatigued and deconditioned. Very poor oral intake.   Objective: Vitals:   12/23/2019 0100 2019-12-23 0339 2019-12-23 0800 12-23-19 0803  BP:  (!) 149/77  (!) 155/127  Pulse: 82 97 100   Resp: (!) 23 (!) 32 (!) 32   Temp:      TempSrc:      SpO2: (!) 76% (!) 50% (!) 60%   Weight:      Height:        Intake/Output Summary (Last 24 hours) at 2019-12-23 0815 Last data filed at 11/22/2019 2000 Gross per 24 hour  Intake --  Output 600 ml  Net -600 ml   Filed Weights   11/15/2019 1402 11/24/2019 2106 11/21/19 1411  Weight: 72.6 kg 82.2 kg 79.6 kg    Examination:   General: deconditioned and ill looking appearing. Severe respiratory distress.  Neurology: eyes closed but able to follow commands and respond to questions. She is very dyspneic.  E ENT: positive pallor, no icterus, oral mucosa dry Cardiovascular: No JVD. S1-S2 present, rhythmic, no gallops, rubs, or murmurs. Trace lower extremity edema. Pulmonary: decreased breath sounds bilaterally, very poor inspiratory effort. Gastrointestinal. Abdomen with no organomegaly, non tender, no rebound or guarding Skin. No rashes Musculoskeletal: no joint deformities     Data Reviewed: I have personally reviewed following labs and imaging studies  CBC: Recent Labs  Lab 11/17/19  1638 11/18/19 0236 11/19/19 0215  WBC 16.2* 13.9* 16.7*  NEUTROABS 13.3* 11.7* 14.5*  HGB 13.2 13.8 14.3  HCT 37.3 39.1 41.5  MCV 85.9 88.3 87.7  PLT 197 198 453*   Basic Metabolic Panel: Recent Labs  Lab 11/19/19 0215 11/20/19 0313 11/21/19 0340 11/22/19 0257 December 19, 2019 0544  NA 143 139 140 138 141  K 3.9 3.2* 4.6 3.9 3.5  CL 106 102 103 99 98  CO2 23 26 26 28 27   GLUCOSE 185* 164* 166* 167* 248*  BUN 18 19 19 22 21   CREATININE 0.55 0.49 0.65 0.58 0.63  CALCIUM 8.3* 8.1* 8.1* 8.2* 8.7*   GFR: Estimated Creatinine Clearance: 73.5 mL/min (by C-G formula based on SCr of 0.63  mg/dL). Liver Function Tests: Recent Labs  Lab 11/19/19 0215 11/20/19 0313 11/21/19 0340 11/22/19 0257 December 19, 2019 0544  AST 32 28 42* 39 46*  ALT 26 20 21 17 20   ALKPHOS 144* 163* 225* 260* 358*  BILITOT 0.9 1.1 1.7* 1.8* 1.9*  PROT 5.9* 6.0* 6.1* 6.1* 6.8  ALBUMIN 2.9* 2.9* 3.1* 3.1* 3.6   No results for input(s): LIPASE, AMYLASE in the last 168 hours. No results for input(s): AMMONIA in the last 168 hours. Coagulation Profile: No results for input(s): INR, PROTIME in the last 168 hours. Cardiac Enzymes: No results for input(s): CKTOTAL, CKMB, CKMBINDEX, TROPONINI in the last 168 hours. BNP (last 3 results) No results for input(s): PROBNP in the last 8760 hours. HbA1C: No results for input(s): HGBA1C in the last 72 hours. CBG: Recent Labs  Lab 11/22/19 0818 11/22/19 1316 11/22/19 1653 11/22/19 1655 11/22/19 2137  GLUCAP 276* 282* 406* 348* 209*   Lipid Profile: No results for input(s): CHOL, HDL, LDLCALC, TRIG, CHOLHDL, LDLDIRECT in the last 72 hours. Thyroid Function Tests: No results for input(s): TSH, T4TOTAL, FREET4, T3FREE, THYROIDAB in the last 72 hours. Anemia Panel: Recent Labs    11/21/19 0340 12-19-19 0544  FERRITIN 553* 512*      Radiology Studies: I have reviewed all of the imaging during this hospital visit personally     Scheduled Meds: . vitamin C  500 mg Oral Daily  . Chlorhexidine Gluconate Cloth  6 each Topical Daily  . chlorpheniramine-HYDROcodone  5 mL Oral Q12H  . enoxaparin (LOVENOX) injection  1 mg/kg Subcutaneous Q12H  . feeding supplement (ENSURE ENLIVE)  237 mL Oral BID BM  . feeding supplement (PRO-STAT SUGAR FREE 64)  30 mL Oral Daily  . FLUoxetine  10 mg Oral Daily  . insulin aspart  0-15 Units Subcutaneous TID WC  . insulin aspart  0-5 Units Subcutaneous QHS  . insulin aspart  3 Units Subcutaneous TID WC  . insulin detemir  14 Units Subcutaneous Daily  . Ipratropium-Albuterol  1 puff Inhalation QID  . linagliptin  5 mg  Oral Daily  . mouth rinse  15 mL Mouth Rinse BID  . methylPREDNISolone (SOLU-MEDROL) injection  40 mg Intravenous Daily  . mometasone-formoterol  2 puff Inhalation BID  . multivitamin with minerals  1 tablet Oral Daily  . zinc sulfate  220 mg Oral Daily   Continuous Infusions:   LOS: 9 days        Makeshia Seat Gerome Apley, MD

## 2019-12-02 DEATH — deceased

## 2020-08-07 IMAGING — CT CT ANGIO CHEST
2 of 6 series · 19 of 46 positions shown · IV contrast (OMNIPAQUE)
Comparison: Radiograph yesterday.

CLINICAL DATA: COVID positive with increasing shortness of breath.
Rising D-dimer.

EXAM:
CT ANGIOGRAPHY CHEST WITH CONTRAST
TECHNIQUE: Multidetector CT imaging of the chest was performed using the
standard protocol during bolus administration of intravenous
contrast. Multiplanar CT image reconstructions and MIPs were
obtained to evaluate the vascular anatomy.
CONTRAST:  100mL OMNIPAQUE IOHEXOL 350 MG/ML SOLN

[Series 5: thins · axial · 0.61mm/px · z∈[+1443,+1718]mm · 17 of 303 slices shown]
[im 14/303  lung]
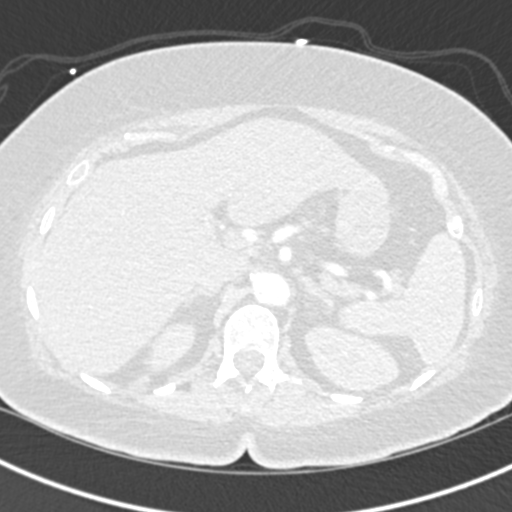
[im 27/303  soft-tissue]
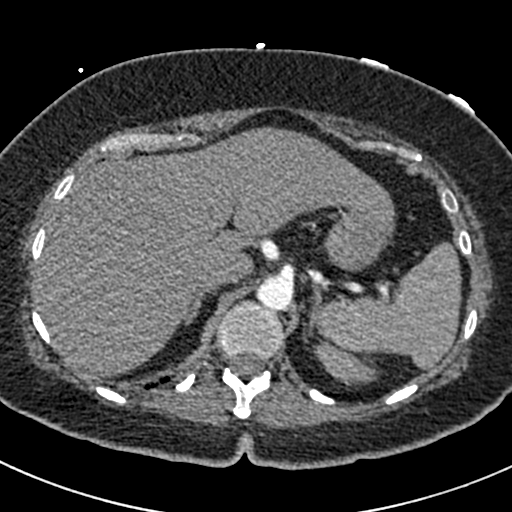
[im 53/303  lung]
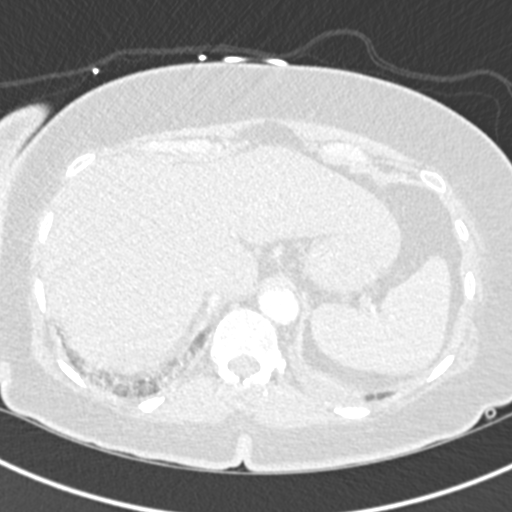
[im 66/303  soft-tissue]
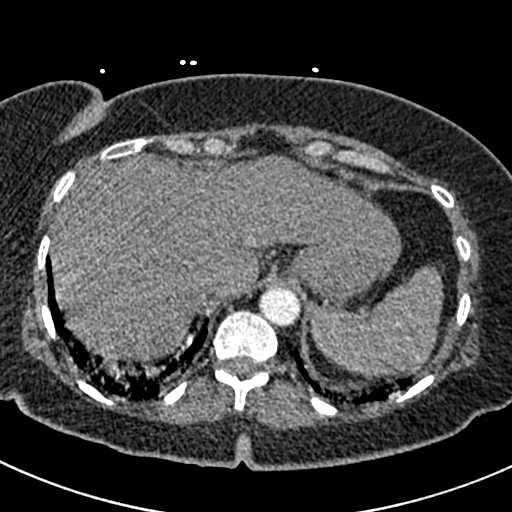
[im 79/303  lung]
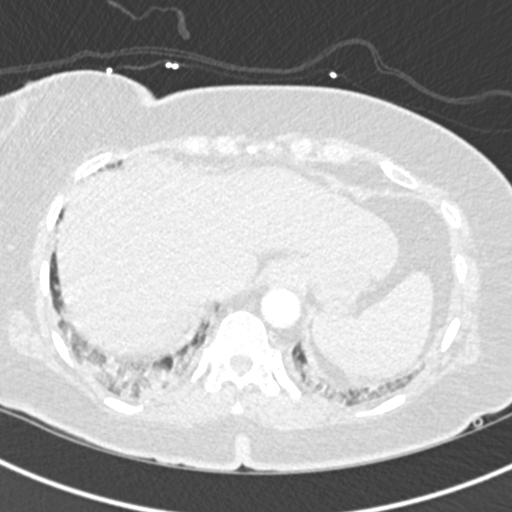
[im 106/303  soft-tissue]
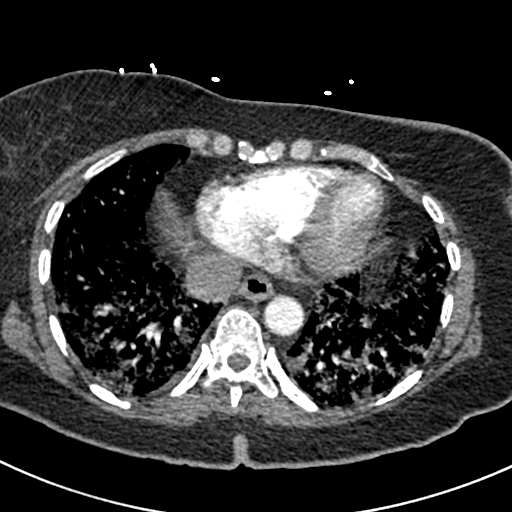
[im 119/303  lung]
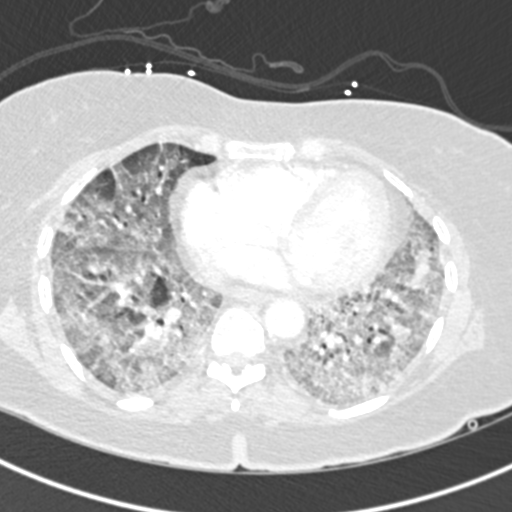
[im 132/303  soft-tissue]
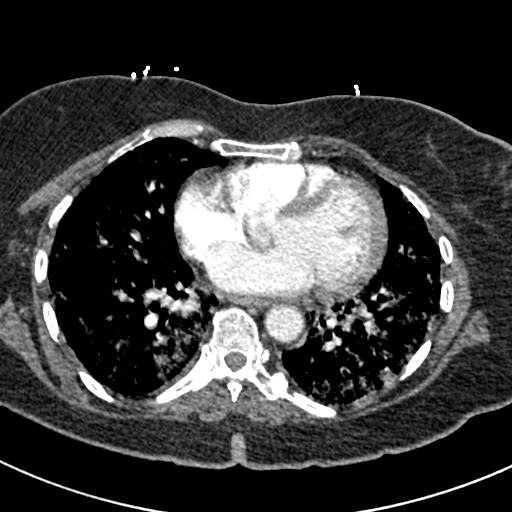
[im 158/303  lung]
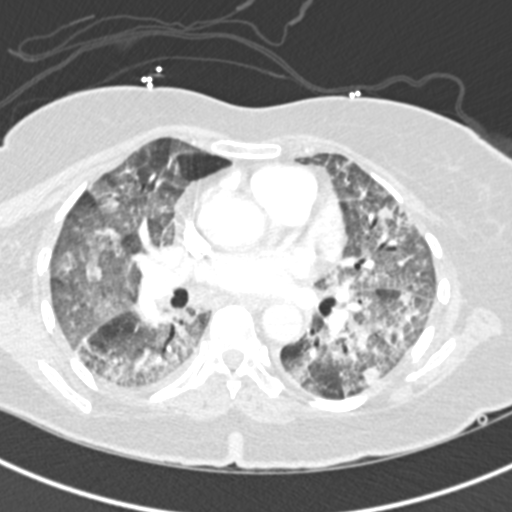
[im 171/303  soft-tissue]
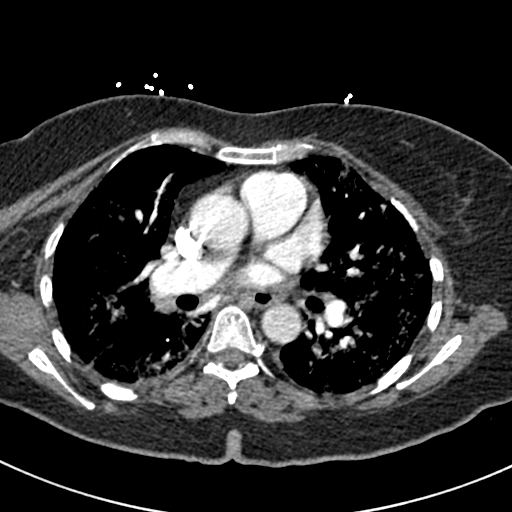
[im 184/303  lung]
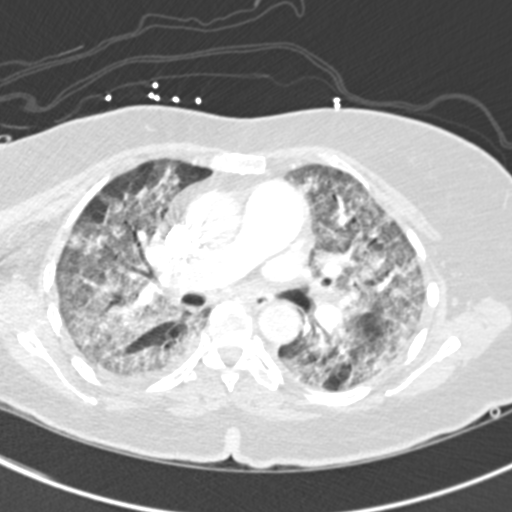
[im 197/303  soft-tissue]
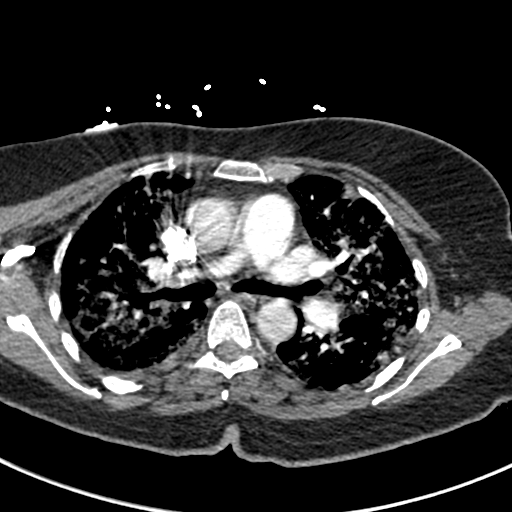
[im 224/303  lung]
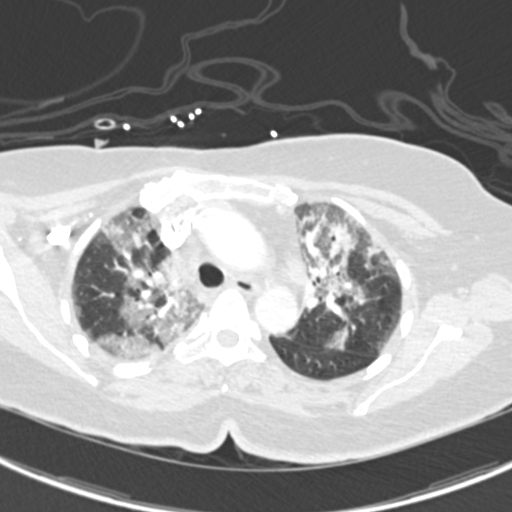
[im 237/303  soft-tissue]
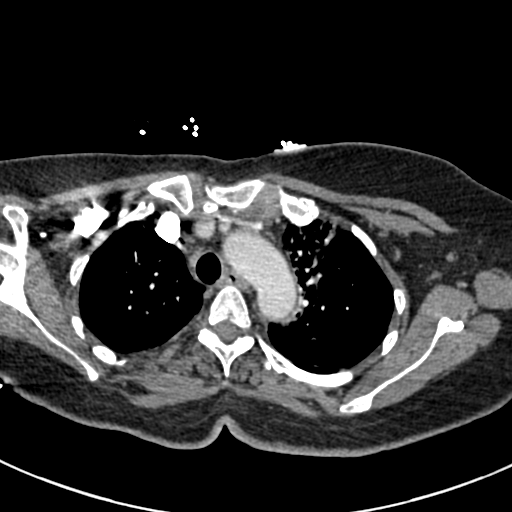
[im 250/303  lung]
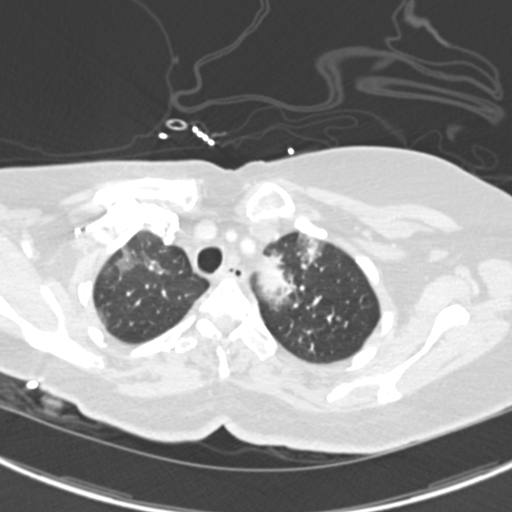
[im 276/303  soft-tissue]
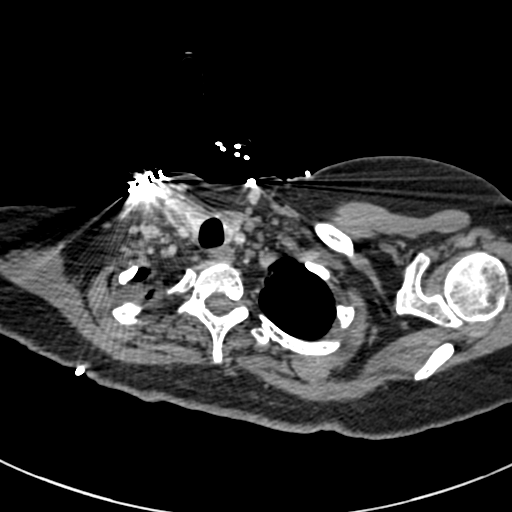
[im 289/303  lung]
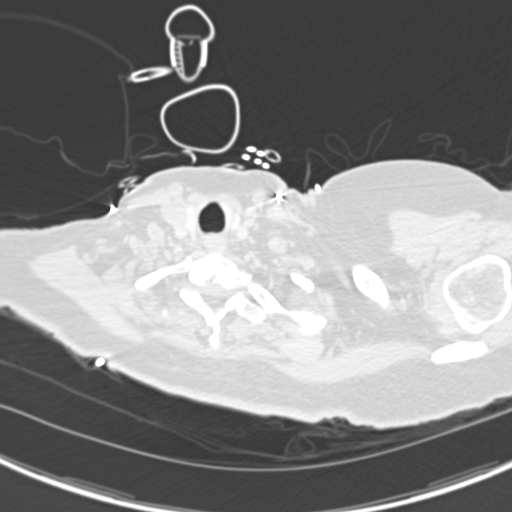

[Series 7: coronal mpr · coronal · 0.58mm/px · 2 of 69 slices shown]
[im 23/69  soft-tissue]
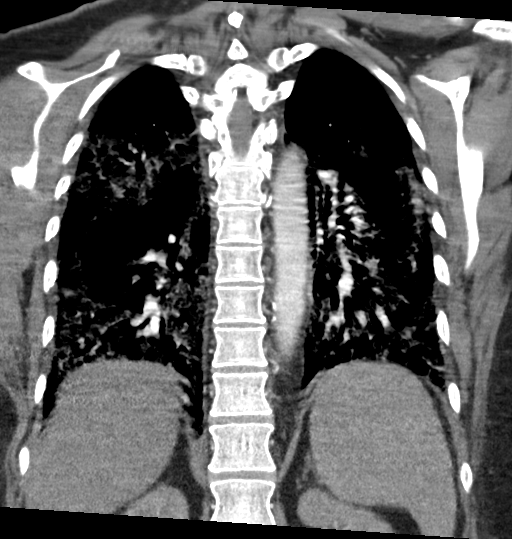
[im 46/69  soft-tissue]
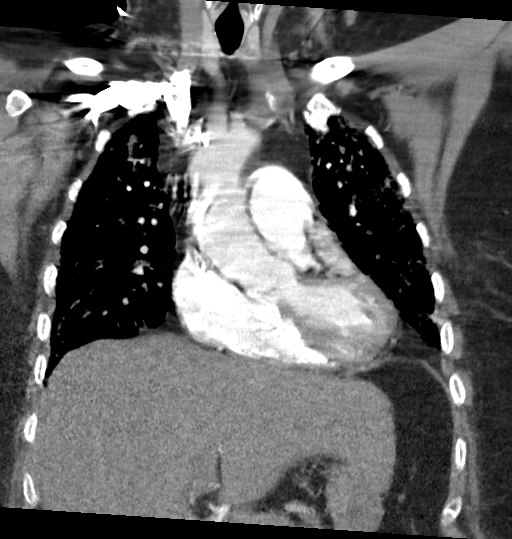

[19 of 46 positions shown; findings below may reference images not displayed]

FINDINGS: Cardiovascular: Motion artifact limits assessment distal to the
segmental branches. There are no central filling defects in the
pulmonary arteries to suggest pulmonary embolus. Distal branches are
not well assessed given motion. Aortic atherosclerosis without
aneurysm or dissection. Upper normal heart size. No pericardial
effusion.

Mediastinum/Nodes: Few shotty mediastinal and hilar lymph nodes that
are not enlarged by size criteria. No thyroid nodule. The esophagus
is patulous without wall thickening.

Lungs/Pleura: Widespread bilateral ground-glass opacities throughout
both lungs with slight apical sparing. Scattered superimposed
consolidations in the bases. No pleural fluid. No pneumothorax.

Upper Abdomen: No acute findings.

Musculoskeletal: There are no acute or suspicious osseous
abnormalities.

Review of the MIP images confirms the above findings.
IMPRESSION: 1. No central pulmonary embolus. Motion artifact limits assessment
distal to the segmental branches.
2. Widespread bilateral ground-glass opacities throughout both
lungs, pattern most consistent with [id number] pneumonia. Parenchymal
involvement is extensive.

Aortic Atherosclerosis (LZXOI-PR0.0).

## 2020-08-13 IMAGING — DX DG CHEST 1V
1 series · 1 of 1 positions shown · non-contrast
Comparison: Portable exam 4027 hours compared to 11/18/2019

CLINICAL DATA: Dyspnea, [id number], diabetes mellitus

EXAM:
CHEST  1 VIEW

[chest ap]
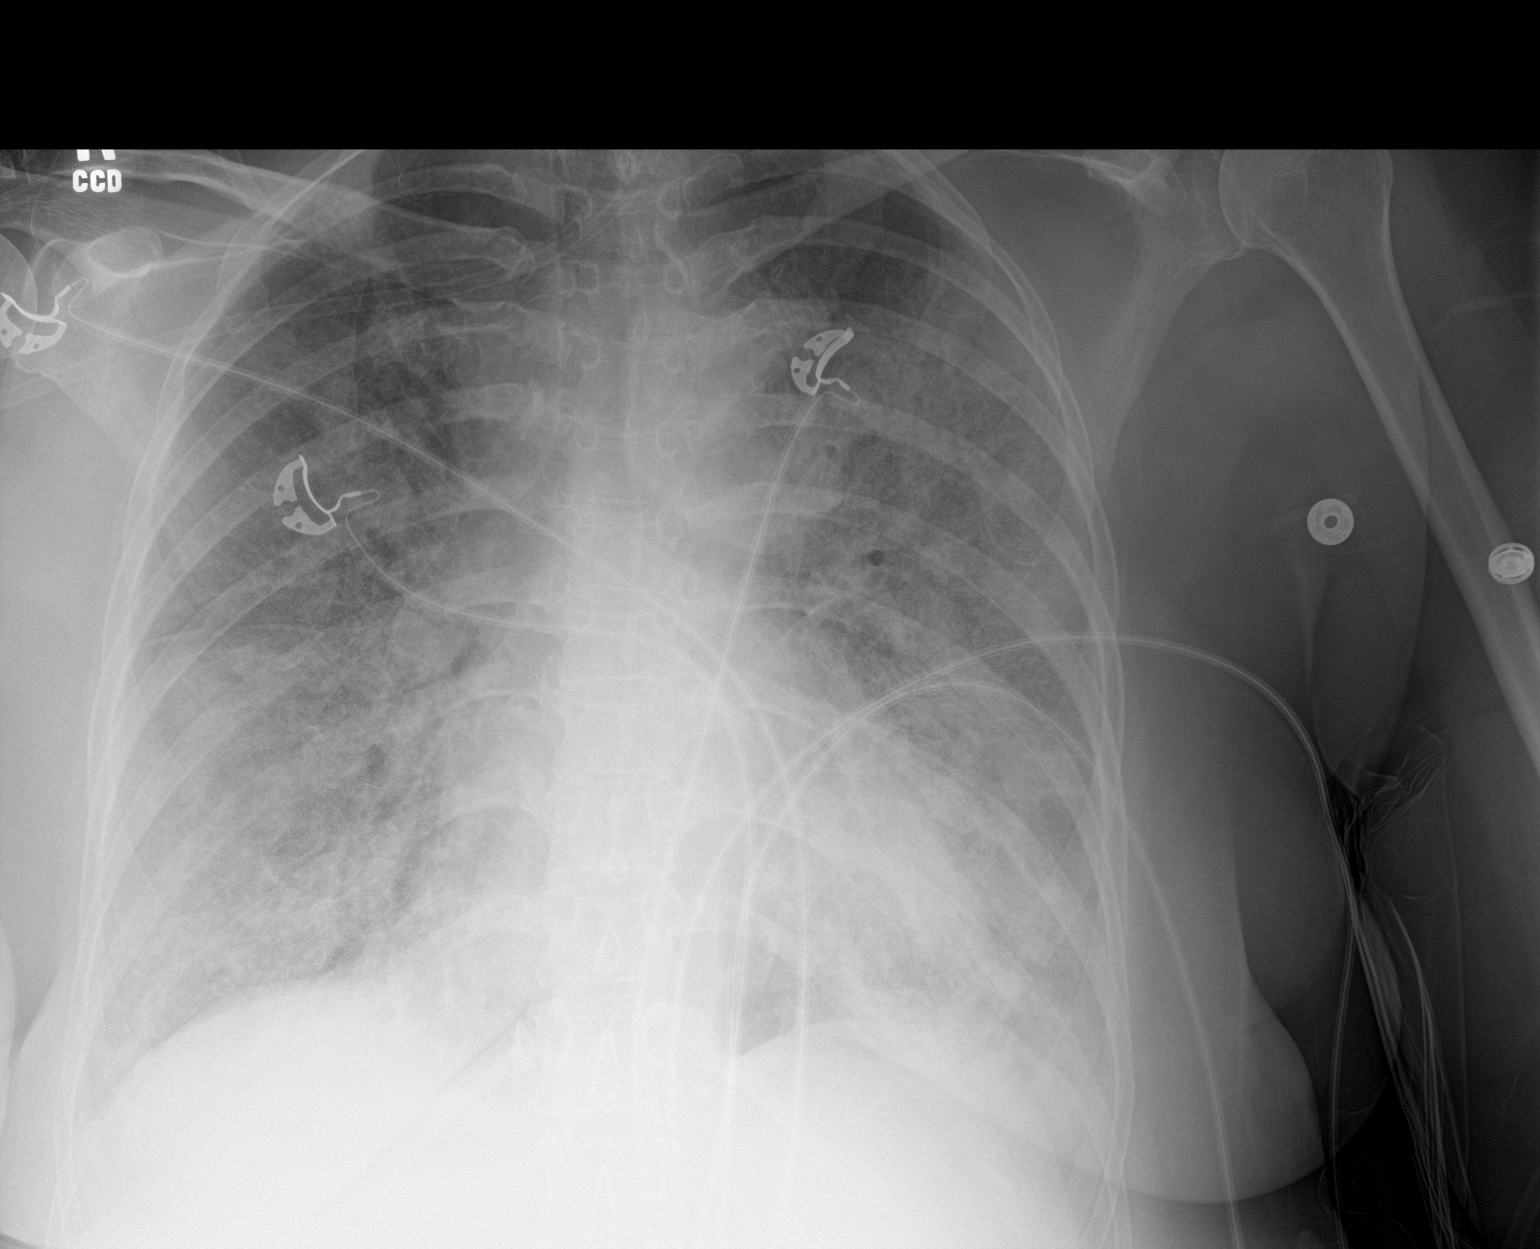

[1 of 1 positions shown; findings below may reference images not displayed]

FINDINGS: Upper normal heart size.

Mediastinal contours unchanged.

Diffuse BILATERAL airspace infiltrates increased since previous
exam, question multifocal pneumonia versus ARDS.

No pleural effusion or pneumothorax.

Osseous structures unremarkable.
IMPRESSION: Diffuse BILATERAL airspace infiltrates question multifocal pneumonia
versus ARDS.
